# Patient Record
Sex: Female | Born: 1935 | Race: White | Hispanic: No | Smoking: Never smoker
Health system: Southern US, Community
[De-identification: ages and names within clinical notes are randomized; demographics above are authoritative.]

## PROBLEM LIST (undated history)

## (undated) DIAGNOSIS — M1990 Unspecified osteoarthritis, unspecified site: Secondary | ICD-10-CM

## (undated) DIAGNOSIS — M810 Age-related osteoporosis without current pathological fracture: Secondary | ICD-10-CM

## (undated) DIAGNOSIS — T7840XA Allergy, unspecified, initial encounter: Secondary | ICD-10-CM

## (undated) HISTORY — PX: ABDOMINAL HYSTERECTOMY: SHX81

## (undated) HISTORY — DX: Unspecified osteoarthritis, unspecified site: M19.90

## (undated) HISTORY — DX: Allergy, unspecified, initial encounter: T78.40XA

## (undated) HISTORY — DX: Age-related osteoporosis without current pathological fracture: M81.0

## (undated) HISTORY — PX: EYE SURGERY: SHX253

## (undated) HISTORY — PX: BREAST SURGERY: SHX581

---

## 1994-02-18 ENCOUNTER — Encounter (INDEPENDENT_AMBULATORY_CARE_PROVIDER_SITE_OTHER): Payer: Self-pay | Admitting: Gastroenterology

## 1998-01-08 ENCOUNTER — Other Ambulatory Visit: Admission: RE | Admit: 1998-01-08 | Discharge: 1998-01-08 | Payer: Self-pay | Admitting: Family Medicine

## 1998-01-10 ENCOUNTER — Other Ambulatory Visit: Admission: RE | Admit: 1998-01-10 | Discharge: 1998-01-10 | Payer: Self-pay | Admitting: Family Medicine

## 1999-12-07 ENCOUNTER — Encounter: Payer: Self-pay | Admitting: Family Medicine

## 1999-12-07 ENCOUNTER — Encounter: Admission: RE | Admit: 1999-12-07 | Discharge: 1999-12-07 | Payer: Self-pay | Admitting: Family Medicine

## 2000-01-12 ENCOUNTER — Encounter: Payer: Self-pay | Admitting: Family Medicine

## 2000-01-12 ENCOUNTER — Encounter: Admission: RE | Admit: 2000-01-12 | Discharge: 2000-01-12 | Payer: Self-pay | Admitting: Family Medicine

## 2000-02-26 ENCOUNTER — Encounter: Payer: Self-pay | Admitting: Family Medicine

## 2000-02-26 ENCOUNTER — Encounter: Admission: RE | Admit: 2000-02-26 | Discharge: 2000-02-26 | Payer: Self-pay | Admitting: Family Medicine

## 2000-03-24 ENCOUNTER — Encounter: Admission: RE | Admit: 2000-03-24 | Discharge: 2000-03-24 | Payer: Self-pay | Admitting: Family Medicine

## 2000-03-24 ENCOUNTER — Encounter: Payer: Self-pay | Admitting: Family Medicine

## 2000-05-13 ENCOUNTER — Ambulatory Visit (HOSPITAL_COMMUNITY): Admission: RE | Admit: 2000-05-13 | Discharge: 2000-05-13 | Payer: Self-pay | Admitting: Family Medicine

## 2000-12-08 ENCOUNTER — Encounter: Admission: RE | Admit: 2000-12-08 | Discharge: 2000-12-08 | Payer: Self-pay | Admitting: Family Medicine

## 2000-12-08 ENCOUNTER — Encounter: Payer: Self-pay | Admitting: Family Medicine

## 2001-11-27 ENCOUNTER — Encounter: Payer: Self-pay | Admitting: Family Medicine

## 2001-11-27 ENCOUNTER — Encounter: Admission: RE | Admit: 2001-11-27 | Discharge: 2001-11-27 | Payer: Self-pay | Admitting: Family Medicine

## 2002-01-15 ENCOUNTER — Other Ambulatory Visit: Admission: RE | Admit: 2002-01-15 | Discharge: 2002-01-15 | Payer: Self-pay | Admitting: Family Medicine

## 2002-10-19 ENCOUNTER — Encounter: Payer: Self-pay | Admitting: Family Medicine

## 2002-10-19 ENCOUNTER — Ambulatory Visit (HOSPITAL_COMMUNITY): Admission: RE | Admit: 2002-10-19 | Discharge: 2002-10-19 | Payer: Self-pay | Admitting: Family Medicine

## 2002-11-29 ENCOUNTER — Encounter: Admission: RE | Admit: 2002-11-29 | Discharge: 2002-11-29 | Payer: Self-pay | Admitting: Family Medicine

## 2002-11-29 ENCOUNTER — Encounter: Payer: Self-pay | Admitting: Family Medicine

## 2002-12-18 ENCOUNTER — Encounter: Admission: RE | Admit: 2002-12-18 | Discharge: 2002-12-18 | Payer: Self-pay | Admitting: Family Medicine

## 2002-12-18 ENCOUNTER — Encounter: Payer: Self-pay | Admitting: Family Medicine

## 2003-01-17 ENCOUNTER — Other Ambulatory Visit: Admission: RE | Admit: 2003-01-17 | Discharge: 2003-01-17 | Payer: Self-pay | Admitting: Family Medicine

## 2003-12-06 ENCOUNTER — Encounter: Admission: RE | Admit: 2003-12-06 | Discharge: 2003-12-06 | Payer: Self-pay | Admitting: Family Medicine

## 2004-01-24 ENCOUNTER — Other Ambulatory Visit: Admission: RE | Admit: 2004-01-24 | Discharge: 2004-01-24 | Payer: Self-pay | Admitting: Family Medicine

## 2004-01-27 ENCOUNTER — Encounter: Admission: RE | Admit: 2004-01-27 | Discharge: 2004-01-27 | Payer: Self-pay | Admitting: Family Medicine

## 2004-07-05 ENCOUNTER — Encounter: Admission: RE | Admit: 2004-07-05 | Discharge: 2004-07-05 | Payer: Self-pay | Admitting: Family Medicine

## 2004-11-30 ENCOUNTER — Ambulatory Visit (HOSPITAL_COMMUNITY): Admission: RE | Admit: 2004-11-30 | Discharge: 2004-11-30 | Payer: Self-pay | Admitting: Family Medicine

## 2005-01-26 ENCOUNTER — Encounter: Admission: RE | Admit: 2005-01-26 | Discharge: 2005-02-09 | Payer: Self-pay | Admitting: Family Medicine

## 2005-03-20 ENCOUNTER — Encounter: Admission: RE | Admit: 2005-03-20 | Discharge: 2005-03-20 | Payer: Self-pay | Admitting: Family Medicine

## 2005-04-09 ENCOUNTER — Encounter: Admission: RE | Admit: 2005-04-09 | Discharge: 2005-04-09 | Payer: Self-pay | Admitting: Family Medicine

## 2005-04-30 ENCOUNTER — Encounter: Admission: RE | Admit: 2005-04-30 | Discharge: 2005-04-30 | Payer: Self-pay | Admitting: Family Medicine

## 2005-05-21 ENCOUNTER — Encounter: Admission: RE | Admit: 2005-05-21 | Discharge: 2005-05-21 | Payer: Self-pay | Admitting: Family Medicine

## 2005-12-09 ENCOUNTER — Ambulatory Visit (HOSPITAL_COMMUNITY): Admission: RE | Admit: 2005-12-09 | Discharge: 2005-12-09 | Payer: Self-pay | Admitting: Family Medicine

## 2006-03-07 ENCOUNTER — Encounter: Admission: RE | Admit: 2006-03-07 | Discharge: 2006-06-05 | Payer: Self-pay | Admitting: Orthopedic Surgery

## 2006-07-13 ENCOUNTER — Encounter: Admission: RE | Admit: 2006-07-13 | Discharge: 2006-07-13 | Payer: Self-pay | Admitting: Family Medicine

## 2006-12-12 ENCOUNTER — Ambulatory Visit (HOSPITAL_COMMUNITY): Admission: RE | Admit: 2006-12-12 | Discharge: 2006-12-12 | Payer: Self-pay | Admitting: Family Medicine

## 2007-01-10 ENCOUNTER — Encounter: Admission: RE | Admit: 2007-01-10 | Discharge: 2007-01-10 | Payer: Self-pay | Admitting: Specialist

## 2007-12-06 ENCOUNTER — Encounter: Admission: RE | Admit: 2007-12-06 | Discharge: 2007-12-06 | Payer: Self-pay | Admitting: Family Medicine

## 2007-12-13 ENCOUNTER — Encounter: Admission: RE | Admit: 2007-12-13 | Discharge: 2007-12-13 | Payer: Self-pay | Admitting: Family Medicine

## 2008-02-02 ENCOUNTER — Encounter: Admission: RE | Admit: 2008-02-02 | Discharge: 2008-02-02 | Payer: Self-pay | Admitting: Family Medicine

## 2008-12-18 ENCOUNTER — Encounter: Admission: RE | Admit: 2008-12-18 | Discharge: 2008-12-18 | Payer: Self-pay | Admitting: Internal Medicine

## 2009-01-29 ENCOUNTER — Emergency Department (HOSPITAL_COMMUNITY): Admission: EM | Admit: 2009-01-29 | Discharge: 2009-01-29 | Payer: Self-pay | Admitting: Emergency Medicine

## 2009-07-02 ENCOUNTER — Ambulatory Visit: Payer: Self-pay | Admitting: Internal Medicine

## 2009-07-02 DIAGNOSIS — K589 Irritable bowel syndrome without diarrhea: Secondary | ICD-10-CM | POA: Insufficient documentation

## 2009-07-02 DIAGNOSIS — K5900 Constipation, unspecified: Secondary | ICD-10-CM | POA: Insufficient documentation

## 2009-12-19 ENCOUNTER — Encounter: Admission: RE | Admit: 2009-12-19 | Discharge: 2009-12-19 | Payer: Self-pay | Admitting: Internal Medicine

## 2010-11-24 ENCOUNTER — Other Ambulatory Visit: Payer: Self-pay | Admitting: Internal Medicine

## 2010-11-24 DIAGNOSIS — Z1231 Encounter for screening mammogram for malignant neoplasm of breast: Secondary | ICD-10-CM

## 2010-12-22 ENCOUNTER — Ambulatory Visit
Admission: RE | Admit: 2010-12-22 | Discharge: 2010-12-22 | Disposition: A | Discharge status: Home / Self Care | Payer: Medicare Other | Source: Ambulatory Visit | Attending: Internal Medicine | Admitting: Internal Medicine

## 2010-12-22 DIAGNOSIS — Z1231 Encounter for screening mammogram for malignant neoplasm of breast: Secondary | ICD-10-CM

## 2012-01-10 ENCOUNTER — Other Ambulatory Visit: Payer: Self-pay | Admitting: Internal Medicine

## 2012-01-10 DIAGNOSIS — Z1231 Encounter for screening mammogram for malignant neoplasm of breast: Secondary | ICD-10-CM

## 2012-01-12 ENCOUNTER — Ambulatory Visit
Admission: RE | Admit: 2012-01-12 | Discharge: 2012-01-12 | Disposition: A | Discharge status: Home / Self Care | Payer: Medicare Other | Source: Ambulatory Visit | Attending: Internal Medicine | Admitting: Internal Medicine

## 2012-01-12 DIAGNOSIS — Z1231 Encounter for screening mammogram for malignant neoplasm of breast: Secondary | ICD-10-CM

## 2012-02-28 ENCOUNTER — Encounter: Payer: Self-pay | Admitting: Family Medicine

## 2012-02-28 ENCOUNTER — Ambulatory Visit (INDEPENDENT_AMBULATORY_CARE_PROVIDER_SITE_OTHER): Payer: Medicare Other | Admitting: Family Medicine

## 2012-02-28 VITALS — BP 157/61 | HR 73 | Temp 98.3°F | Resp 16 | Ht 63.0 in | Wt 143.0 lb

## 2012-02-28 DIAGNOSIS — R21 Rash and other nonspecific skin eruption: Secondary | ICD-10-CM

## 2012-02-28 DIAGNOSIS — J029 Acute pharyngitis, unspecified: Secondary | ICD-10-CM

## 2012-02-28 MED ORDER — CEPHALEXIN 500 MG PO CAPS: 500.0000 mg | ORAL_CAPSULE | Freq: Two times a day (BID) | ORAL | Status: AC

## 2012-02-28 NOTE — Progress Notes (Signed)
An 78 year old woman comes in with 2 problems. She's had 3 weeks of sore throat following the initiation of Cymbalta medication for irritable bladder. She also has multiple red papules on her forearms after working in the [employer] at her house 2 days ago.  She's had no constitutional sx. States she went to Dr. Vicente Males office and the physician assistant new less than she does. The Cymbalta is comes here $200 a month.  Objective: Mild erythema in the pharynx bilaterally worse on the left. Neck shows no stiffness or adenopathy  Patient's skin shows multiple 1 mm nonparetic erythematous papules which is covered with a Band-Aid. She has had one area on the right forearm that 1 cm violaceous, and macerated.  Assessment: Mild cellulitis right arm, trigger bites, pharyngitis.  Plan: Advised to stop the Cymbalta  Start Keflex 500 twice a day for a week

## 2012-03-04 ENCOUNTER — Telehealth: Payer: Self-pay

## 2012-03-04 MED ORDER — AZITHROMYCIN 250 MG PO TABS: ORAL_TABLET | ORAL | Status: AC

## 2012-03-04 MED ORDER — DIPHENHYD-HYDROCORT-NYSTATIN MT SUSP: 5.0000 mL | Freq: Four times a day (QID) | OROMUCOSAL | Status: DC | PRN

## 2012-03-04 NOTE — Telephone Encounter (Signed)
Sent in z pack and duke's magic mouthwash.

## 2012-03-04 NOTE — Telephone Encounter (Signed)
LMOM notifying daughter to d/c Cephalexin and pickup new meds rx'd.  CB if any further ?'s.

## 2012-03-04 NOTE — Telephone Encounter (Signed)
PATIENT'S DAUGHTER CALLED TO SAY HER MOTHER SAW DR. Milus Glazier LAST WEEK FOR A SORE THROAT. SHE WAS GIVEN CEPHALEXIN 500MG . SHE IS HAVING  DIARRHEA,VOMITING,NAUSEA & DIZZINESS. THEY CALLED THE PHARMACIST AND HE SAID THESE WERE COMMON SIDE EFFECTS OF THIS MEDICATION. PLEASE CALL THEM BACK TO TELL HER SOMETHING ELSE SHE CAN TAKE. SHE ALSO STILL HAS HER SORE THROAT. BEST PHONE [phone] (DAUGHTER'S NAME IS SUSAN Crespo)   PHARMACY CHOICE IS (I FORGOT TO ASK)     MBC

## 2013-07-12 ENCOUNTER — Other Ambulatory Visit: Payer: Self-pay

## 2013-07-12 DIAGNOSIS — Z1231 Encounter for screening mammogram for malignant neoplasm of breast: Secondary | ICD-10-CM

## 2013-08-02 ENCOUNTER — Ambulatory Visit: Payer: Medicare Other

## 2014-09-02 ENCOUNTER — Ambulatory Visit (INDEPENDENT_AMBULATORY_CARE_PROVIDER_SITE_OTHER): Payer: Medicare Other | Admitting: Family Medicine

## 2014-09-02 VITALS — BP 128/70 | HR 72 | Temp 98.0°F | Resp 16 | Ht 63.0 in | Wt 140.0 lb

## 2014-09-02 DIAGNOSIS — R197 Diarrhea, unspecified: Secondary | ICD-10-CM

## 2014-09-02 DIAGNOSIS — B349 Viral infection, unspecified: Secondary | ICD-10-CM

## 2014-09-02 DIAGNOSIS — R05 Cough: Secondary | ICD-10-CM

## 2014-09-02 DIAGNOSIS — R059 Cough, unspecified: Secondary | ICD-10-CM

## 2014-09-02 LAB — POCT CBC
Granulocyte percent: 65.3 % (ref 37–80)
HCT, POC: 43 % (ref 37.7–47.9)
Hemoglobin: 13.8 g/dL (ref 12.2–16.2)
Lymph, poc: 3.9 — AB (ref 0.6–3.4)
MCH, POC: 28.7 pg (ref 27–31.2)
MCHC: 32.2 g/dL (ref 31.8–35.4)
MCV: 89.1 fL (ref 80–97)
MID (cbc): 0.1 (ref 0–0.9)
MPV: 6.6 fL (ref 0–99.8)
POC Granulocyte: 7.6 — AB (ref 2–6.9)
POC LYMPH PERCENT: 33.6 % (ref 10–50)
POC MID %: 1.1 % (ref 0–12)
Platelet Count, POC: 301 10*3/uL (ref 142–424)
RBC: 4.83 M/uL (ref 4.04–5.48)
RDW, POC: 14.7 %
WBC: 11.6 10*3/uL — AB (ref 4.6–10.2)

## 2014-09-02 LAB — COMPLETE METABOLIC PANEL WITHOUT GFR
AST: 21 U/L (ref 0–37)
Albumin: 4.4 g/dL (ref 3.5–5.2)
Alkaline Phosphatase: 50 U/L (ref 39–117)
CO2: 30 meq/L (ref 19–32)
Glucose, Bld: 101 mg/dL — ABNORMAL HIGH (ref 70–99)
Sodium: 138 meq/L (ref 135–145)
Total Protein: 7.1 g/dL (ref 6.0–8.3)

## 2014-09-02 LAB — COMPLETE METABOLIC PANEL WITH GFR
ALT: 15 U/L (ref 0–35)
BUN: 14 mg/dL (ref 6–23)
Calcium: 10.1 mg/dL (ref 8.4–10.5)
Chloride: 98 mEq/L (ref 96–112)
Creat: 0.76 mg/dL (ref 0.50–1.10)
GFR, Est African American: 82 mL/min
GFR, Est Non African American: 71 mL/min
Potassium: 4.3 mEq/L (ref 3.5–5.3)
Total Bilirubin: 0.6 mg/dL (ref 0.2–1.2)

## 2014-09-02 NOTE — Patient Instructions (Signed)
Food Choices to Help Relieve Diarrhea When you have diarrhea, the foods you eat and your eating habits are very important. Choosing the right foods and drinks can help relieve diarrhea. Also, because diarrhea can last up to 7 days, you need to replace lost fluids and electrolytes (such as sodium, potassium, and chloride) in order to help prevent dehydration.  WHAT GENERAL GUIDELINES DO I NEED TO FOLLOW?  Slowly drink 1 cup (8 oz) of fluid for each episode of diarrhea. If you are getting enough fluid, your urine will be clear or pale yellow.  Eat starchy foods. Some good choices include white rice, white toast, pasta, low-fiber cereal, baked potatoes (without the skin), saltine crackers, and bagels.  Avoid large servings of any cooked vegetables.  Limit fruit to two servings per day. A serving is  cup or 1 small piece.  Choose foods with less than 2 g of fiber per serving.  Limit fats to less than 8 tsp (38 g) per day.  Avoid fried foods.  Eat foods that have probiotics in them. Probiotics can be found in certain dairy products.  Avoid foods and beverages that may increase the speed at which food moves through the stomach and intestines (gastrointestinal tract). Things to avoid include:  High-fiber foods, such as dried fruit, raw fruits and vegetables, nuts, seeds, and whole grain foods.  Spicy foods and high-fat foods.  Foods and beverages sweetened with high-fructose corn syrup, honey, or sugar alcohols such as xylitol, sorbitol, and mannitol. WHAT FOODS ARE RECOMMENDED? Grains White rice. White, French, or pita breads (fresh or toasted), including plain rolls, buns, or bagels. White pasta. Saltine, soda, or graham crackers. Pretzels. Low-fiber cereal. Cooked cereals made with water (such as cornmeal, farina, or cream cereals). Plain muffins. Matzo. Melba toast. Zwieback.  Vegetables Potatoes (without the skin). Strained tomato and vegetable juices. Most well-cooked and canned  vegetables without seeds. Tender lettuce. Fruits Cooked or canned applesauce, apricots, cherries, fruit cocktail, grapefruit, peaches, pears, or plums. Fresh bananas, apples without skin, cherries, grapes, cantaloupe, grapefruit, peaches, oranges, or plums.  Meat and Other Protein Products Baked or boiled chicken. Eggs. Tofu. Fish. Seafood. Smooth peanut butter. Ground or well-cooked tender beef, ham, veal, lamb, pork, or poultry.  Dairy Plain yogurt, kefir, and unsweetened liquid yogurt. Lactose-free milk, buttermilk, or soy milk. Plain hard cheese. Beverages Sport drinks. Clear broths. Diluted fruit juices (except prune). Regular, caffeine-free sodas such as ginger ale. Water. Decaffeinated teas. Oral rehydration solutions. Sugar-free beverages not sweetened with sugar alcohols. Other Bouillon, broth, or soups made from recommended foods.  The items listed above may not be a complete list of recommended foods or beverages. Contact your dietitian for more options. WHAT FOODS ARE NOT RECOMMENDED? Grains Whole grain, whole wheat, bran, or rye breads, rolls, pastas, crackers, and cereals. Wild or brown rice. Cereals that contain more than 2 g of fiber per serving. Corn tortillas or taco shells. Cooked or dry oatmeal. Granola. Popcorn. Vegetables Raw vegetables. Cabbage, broccoli, Brussels sprouts, artichokes, baked beans, beet greens, corn, kale, legumes, peas, sweet potatoes, and yams. Potato skins. Cooked spinach and cabbage. Fruits Dried fruit, including raisins and dates. Raw fruits. Stewed or dried prunes. Fresh apples with skin, apricots, mangoes, pears, raspberries, and strawberries.  Meat and Other Protein Products Chunky peanut butter. Nuts and seeds. Beans and lentils. Bacon.  Dairy High-fat cheeses. Milk, chocolate milk, and beverages made with milk, such as milk shakes. Cream. Ice cream. Sweets and Desserts Sweet rolls, doughnuts, and sweet breads. Pancakes   and waffles. Fats and  Oils Butter. Cream sauces. Margarine. Salad oils. Plain salad dressings. Olives. Avocados.  Beverages Caffeinated beverages (such as coffee, tea, soda, or energy drinks). Alcoholic beverages. Fruit juices with pulp. Prune juice. Soft drinks sweetened with high-fructose corn syrup or sugar alcohols. Other Coconut. Hot sauce. Chili powder. Mayonnaise. Gravy. Cream-based or milk-based soups.  The items listed above may not be a complete list of foods and beverages to avoid. Contact your dietitian for more information. WHAT SHOULD I DO IF I BECOME DEHYDRATED? Diarrhea can sometimes lead to dehydration. Signs of dehydration include dark urine and dry mouth and skin. If you think you are dehydrated, you should rehydrate with an oral rehydration solution. These solutions can be purchased at pharmacies, retail stores, or online.  Drink -1 cup (120-240 mL) of oral rehydration solution each time you have an episode of diarrhea. If drinking this amount makes your diarrhea worse, try drinking smaller amounts more often. For example, drink 1-3 tsp (5-15 mL) every 5-10 minutes.  A general rule for staying hydrated is to drink 1-2 L of fluid per day. Talk to your health care provider about the specific amount you should be drinking each day. Drink enough fluids to keep your urine clear or pale yellow. Document Released: 12/04/2003 Document Revised: 09/18/2013 Document Reviewed: 08/06/2013 [facility] Patient Information 2015 [place], [place]. This information is not intended to replace advice given to you by your health care provider. Make sure you discuss any questions you have with your health care provider. Viral Infections A virus is a type of germ. Viruses can cause:  Minor sore throats.  Aches and pains.  Headaches.  Runny nose.  Rashes.  Watery eyes.  Tiredness.  Coughs.  Loss of appetite.  Feeling sick to your stomach (nausea).  Throwing up (vomiting).  Watery poop (diarrhea). HOME CARE    Only take medicines as told by your doctor.  Drink enough water and fluids to keep your pee (urine) clear or pale yellow. Sports drinks are a good choice.  Get plenty of rest and eat healthy. Soups and broths with crackers or rice are fine. GET HELP RIGHT AWAY IF:   You have a very bad headache.  You have shortness of breath.  You have chest pain or neck pain.  You have an unusual rash.  You cannot stop throwing up.  You have watery poop that does not stop.  You cannot keep fluids down.  You or your child has a temperature by mouth above 102 F (38.9 C), not controlled by medicine.  Your baby is older than 3 months with a rectal temperature of 102 F (38.9 C) or higher.  Your baby is 213 months old or younger with a rectal temperature of 100.4 F (38 C) or higher. MAKE SURE YOU:   Understand these instructions.  Will watch this condition.  Will get help right away if you are not doing well or get worse. Document Released: 08/26/2008 Document Revised: 12/06/2011 Document Reviewed: 01/19/2011 [facility] Patient Information 2015 [place], [place]. This information is not intended to replace advice given to you by your health care provider. Make sure you discuss any questions you have with your health care provider.

## 2014-09-02 NOTE — Progress Notes (Signed)
Chief Complaint:  Chief Complaint  Patient presents with  . Laryngitis    x1 week  . Cough  . Diarrhea    x2 weeks   . Fatigue  . Shaking    HPI: Theresa Cross is a 80 y.o. female who is here for  Multiple sxs, she has had nonbloody diarrhea x 2 weeks, has had a cough and  laryngitis for 1 week , she was put on prednisone and 2 different antibiotics by her PCPs office, took her off prednisone and abx since she was shaking and having diarrhea . Has put on immodium, tried eating different things and building bacteria and nothing is helping. No fever or chills, CP , SOB. The shaking has stopped. No stool samples were taken. She has been eating and drinking ok.  Colonscopy was normal but that was many years ago. Deneis abd pain, n.ausea or vomiting   Past Medical History  Diagnosis Date  . Allergy   . Arthritis   . Osteoporosis    Past Surgical History  Procedure Laterality Date  . Breast surgery    . Eye surgery    . Abdominal hysterectomy     History   Social History  . Marital Status: Widowed    Spouse Name: N/A    Number of Children: N/A  . Years of Education: N/A   Social History Main Topics  . Smoking status: Never Smoker   . Smokeless tobacco: None  . Alcohol Use: None  . Drug Use: None  . Sexual Activity: None   Other Topics Concern  . None   Social History Narrative   Family History  Problem Relation Age of Onset  . Cancer Mother   . Heart disease Mother   . Cancer Brother   . Cancer Brother    Allergies  Allergen Reactions  . Amoxicillin   . Codeine   . Gabapentin   . Morphine   . Morphine Sulfate    Prior to Admission medications   Medication Sig Start Date End Date Taking? Authorizing Provider  escitalopram (LEXAPRO) 5 MG tablet Take 5 mg by mouth daily.   Yes Historical Provider, MD  fluticasone (FLONASE) 50 MCG/ACT nasal spray Place 2 sprays into both nostrils daily.   Yes Historical Provider, MD  levofloxacin (LEVAQUIN) 500 MG  tablet Take 500 mg by mouth daily.   Yes Historical Provider, MD  loperamide (IMODIUM A-D) 2 MG tablet Take 2 mg by mouth 4 (four) times daily as needed for diarrhea or loose stools.   Yes Historical Provider, MD  predniSONE (DELTASONE) 20 MG tablet Take 20 mg by mouth daily with breakfast.   Yes Historical Provider, MD  Probiotic Product (ALIGN PO) Take by mouth.   Yes Historical Provider, MD     ROS: The patient denies fevers, chills, night sweats, unintentional weight loss, chest pain, palpitations, wheezing, dyspnea on exertion, nausea, vomiting, abdominal pain, dysuria, hematuria, melena, numbness, weakness, or tingling.   All other systems have been reviewed and were otherwise negative with the exception of those mentioned in the HPI and as above.    PHYSICAL EXAM: Filed Vitals:   09/02/14 1243  BP: 128/70  Pulse: 72  Temp: 98 F (36.7 C)  Resp: 16   Filed Vitals:   09/02/14 1243  Height: 5\' 3"  (1.6 m)  Weight: 140 lb (63.504 kg)   Body mass index is 24.81 kg/(m^2).  General: Alert, no acute distress HEENT:  Normocephalic, atraumatic, oropharynx patent.  EOMI, PERRLA. TM nl, erythematous throat, No exudates. + boggy nares, neg sinus tenderness. Cardiovascular:  Regular rate and rhythm, no rubs murmurs or gallops.  No Carotid bruits, radial pulse intact. No pedal edema.  Respiratory: Clear to auscultation bilaterally.  No wheezes, rales, or rhonchi.  No cyanosis, no use of accessory musculature GI: No organomegaly, abdomen is soft and non-tender, positive bowel sounds.  No masses. Skin: No rashes. Neurologic: Facial musculature symmetric. Psychiatric: Patient is appropriate throughout our interaction. Lymphatic: No cervical lymphadenopathy Musculoskeletal: Gait intact.   LABS: Results for orders placed or performed in visit on 09/02/14  POCT CBC  Result Value Ref Range   WBC 11.6 (A) 4.6 - 10.2 K/uL   Lymph, poc 3.9 (A) 0.6 - 3.4   POC LYMPH PERCENT 33.6 10 - 50 %L    MID (cbc) 0.1 0 - 0.9   POC MID % 1.1 0 - 12 %M   POC Granulocyte 7.6 (A) 2 - 6.9   Granulocyte percent 65.3 37 - 80 %G   RBC 4.83 4.04 - 5.48 M/uL   Hemoglobin 13.8 12.2 - 16.2 g/dL   HCT, POC 13.043.0 86.537.7 - 47.9 %   MCV 89.1 80 - 97 fL   MCH, POC 28.7 27 - 31.2 pg   MCHC 32.2 31.8 - 35.4 g/dL   RDW, POC 78.414.7 %   Platelet Count, POC 301 142 - 424 K/uL   MPV 6.6 0 - 99.8 fL     EKG/XRAY:   [facility]   ASSESSMENT/PLAN: Encounter Diagnoses  Name Primary?  . Diarrhea   . Cough   . Viral illness Yes   Probably still viral illness, Will bring back stool Cx and C Diff before I give her anything She was already on 2 abx and that did not help.  Will f/u with labs, push fluids. Take otc cough meds ie delsyn Will detemine treatement once get stool results.   Gross sideeffects, risk and benefits, and alternatives of medications d/w patient. Patient is aware that all medications have potential sideeffects and we are unable to predict every sideeffect or drug-drug interaction that may occur.  Afnan Emberton PHUONG, DO 09/02/2014 2:27 PM

## 2014-09-03 ENCOUNTER — Telehealth: Payer: Self-pay

## 2014-09-03 ENCOUNTER — Other Ambulatory Visit: Payer: Self-pay | Admitting: Family Medicine

## 2014-09-03 MED ORDER — DIPHENOXYLATE-ATROPINE 2.5-0.025 MG PO TABS: 1.0000 | ORAL_TABLET | Freq: Every day | ORAL | Status: DC | PRN

## 2014-09-03 NOTE — Telephone Encounter (Signed)
Pt also left a note in her bag with her stool samples she left. The note says that she is still having loose bowels.  The note also says that her throat is sore and she is shaky.

## 2014-09-03 NOTE — Telephone Encounter (Signed)
Pt states she needs to speak with Dr. Conley RollsLe. Patient wants something better for the condition she was seen for yesterday. Please return call and advise. Patient wants to speak with Dr. Conley RollsLe. Cb # [phone]

## 2014-09-03 NOTE — Telephone Encounter (Signed)
Pt states that she is having severe diarrhea to the point her rear is raw. Pt is concerned that she will not be able to get a prescription.  Advised her to increase fluids- pt stated she has been drinking 7up and taking Immodium. Encouraged her to not drink sugary fluids, try water plain or Gator aid with the electrolytes. Pt is going to have daughter pick up some. Advised her to use warm cloths to clean herself to help with the rawness instead of toilet tissue.   Sample has been dropped off to the office. Pending results. Please advise if pt can take something else for the diarrhea.    Daughter [phone] work [phone]

## 2014-09-03 NOTE — Telephone Encounter (Signed)
He throat is fine, she is having diarrhea, no stomach pains. No fevers, will try a short course of lomotil and see how she does, stool cx and c diff pending

## 2014-09-04 ENCOUNTER — Other Ambulatory Visit: Payer: Self-pay | Admitting: Radiology

## 2014-09-04 DIAGNOSIS — R197 Diarrhea, unspecified: Secondary | ICD-10-CM

## 2014-09-05 NOTE — Addendum Note (Signed)
Addended by: Sheldon SilvanEIS, Klea Nall E on: 09/05/2014 04:53 PM   Modules accepted: Orders

## 2014-09-06 LAB — CLOSTRIDIUM DIFFICILE BY PCR: Toxigenic C. Difficile by PCR: NOT DETECTED

## 2014-09-07 LAB — STOOL CULTURE

## 2014-09-10 ENCOUNTER — Telehealth: Payer: Self-pay

## 2014-09-10 NOTE — Telephone Encounter (Signed)
Amy from [facility] called and left a message to request medical records for pt. Called back and left her a message that she will need to fax us a written request for the records.

## 2015-12-03 ENCOUNTER — Ambulatory Visit (INDEPENDENT_AMBULATORY_CARE_PROVIDER_SITE_OTHER): Payer: Medicare Other | Admitting: Emergency Medicine

## 2015-12-03 VITALS — BP 168/80 | HR 76 | Temp 97.8°F | Resp 17 | Ht 63.0 in | Wt 141.0 lb

## 2015-12-03 DIAGNOSIS — R21 Rash and other nonspecific skin eruption: Secondary | ICD-10-CM | POA: Diagnosis not present

## 2015-12-03 LAB — POCT SKIN KOH: Skin KOH, POC: NEGATIVE

## 2015-12-03 MED ORDER — TRIAMCINOLONE 0.1 % CREAM:EUCERIN CREAM 1:1: 1.0000 "application " | TOPICAL_CREAM | Freq: Two times a day (BID) | CUTANEOUS | Status: DC

## 2015-12-03 NOTE — Patient Instructions (Signed)
Eczema Eczema, also called atopic dermatitis, is a skin disorder that causes inflammation of the skin. It causes a red rash and dry, scaly skin. The skin becomes very itchy. Eczema is generally worse during the cooler winter months and often improves with the warmth of summer. Eczema usually starts showing signs in infancy. Some children outgrow eczema, but it may last through adulthood.  CAUSES  The exact cause of eczema is not known, but it appears to run in families. People with eczema often have a family history of eczema, allergies, asthma, or hay fever. Eczema is not contagious. Flare-ups of the condition may be caused by:   Contact with something you are sensitive or allergic to.   Stress. SIGNS AND SYMPTOMS  Dry, scaly skin.   Red, itchy rash.   Itchiness. This may occur before the skin rash and may be very intense.  DIAGNOSIS  The diagnosis of eczema is usually made based on symptoms and medical history. TREATMENT  Eczema cannot be cured, but symptoms usually can be controlled with treatment and other strategies. A treatment plan might include:  Controlling the itching and scratching.   Use over-the-counter antihistamines as directed for itching. This is especially useful at night when the itching tends to be worse.   Use over-the-counter steroid creams as directed for itching.   Avoid scratching. Scratching makes the rash and itching worse. It may also result in a skin infection (impetigo) due to a break in the skin caused by scratching.   Keeping the skin well moisturized with creams every day. This will seal in moisture and help prevent dryness. Lotions that contain alcohol and water should be avoided because they can dry the skin.   Limiting exposure to things that you are sensitive or allergic to (allergens).   Recognizing situations that cause stress.   Developing a plan to manage stress.  HOME CARE INSTRUCTIONS   Only take over-the-counter or  prescription medicines as directed by your health care provider.   Do not use anything on the skin without checking with your health care provider.   Keep baths or showers short (5 minutes) in warm (not hot) water. Use mild cleansers for bathing. These should be unscented. You may add nonperfumed bath oil to the bath water. It is best to avoid soap and bubble bath.   Immediately after a bath or shower, when the skin is still damp, apply a moisturizing ointment to the entire body. This ointment should be a petroleum ointment. This will seal in moisture and help prevent dryness. The thicker the ointment, the better. These should be unscented.   Keep fingernails cut short. Children with eczema may need to wear soft gloves or mittens at night after applying an ointment.   Dress in clothes made of cotton or cotton blends. Dress lightly, because heat increases itching.   A child with eczema should stay away from anyone with fever blisters or cold sores. The virus that causes fever blisters (herpes simplex) can cause a serious skin infection in children with eczema. SEEK MEDICAL CARE IF:   Your itching interferes with sleep.   Your rash gets worse or is not better within 1 week after starting treatment.   You see pus or soft yellow scabs in the rash area.   You have a fever.   You have a rash flare-up after contact with someone who has fever blisters.    This information is not intended to replace advice given to you by your health care   provider. Make sure you discuss any questions you have with your health care provider.   Document Released: 09/10/2000 Document Revised: 07/04/2013 Document Reviewed: 04/16/2013 Elsevier Interactive Patient Education 2016 [employer]vier Inc.  

## 2015-12-03 NOTE — Progress Notes (Signed)
By signing my name below, I, Stann Ore, attest that this documentation has been prepared under the direction and in the presence of Lesle Chris, MD. Electronically Signed: Stann Ore, Scribe. 12/03/2015 , 12:10 PM .  Patient was seen in room 3 .  Chief Complaint:  Chief Complaint  Patient presents with  . Rash    on legs    HPI: Theresa Cross is a 82 y.o. female who reports to [facility] today complaining of a rash over the back of her right leg that was noticed this morning. She mentions that the areas aren't itchy until after she took a shower this morning. She also states having a rash spot over her forehead between her eye brows that was noted about a month ago. She informs having a new cat in her house.   Past Medical History  Diagnosis Date  . Allergy   . Arthritis   . Osteoporosis    Past Surgical History  Procedure Laterality Date  . Breast surgery    . Eye surgery    . Abdominal hysterectomy     Social History   Social History  . Marital Status: Widowed    Spouse Name: N/A  . Number of Children: N/A  . Years of Education: N/A   Social History Main Topics  . Smoking status: Never Smoker   . Smokeless tobacco: None  . Alcohol Use: None  . Drug Use: None  . Sexual Activity: Not Asked   Other Topics Concern  . None   Social History Narrative   Family History  Problem Relation Age of Onset  . Cancer Mother   . Heart disease Mother   . Cancer Brother   . Cancer Brother    Allergies  Allergen Reactions  . Amoxicillin   . Codeine   . Gabapentin   . Morphine   . Morphine Sulfate    Prior to Admission medications   Not on File     ROS:  Constitutional: negative for fever, chills, night sweats, weight changes, or fatigue  HEENT: negative for vision changes, hearing loss, congestion, rhinorrhea, ST, epistaxis, or sinus pressure Cardiovascular: negative for chest pain or palpitations Respiratory: negative for hemoptysis, wheezing, shortness of  breath, or cough Abdominal: negative for abdominal pain, nausea, vomiting, diarrhea, or constipation Dermatological: positive for rash Neurologic: negative for headache, dizziness, or syncope All other systems reviewed and are otherwise negative with the exception to those above and in the HPI.  PHYSICAL EXAM: Filed Vitals:   12/03/15 1154  BP: 168/80  Pulse: 76  Temp: 97.8 F (36.6 C)  Resp: 17   Body mass index is 24.98 kg/(m^2).   General: Alert, no acute distress HEENT:  Normocephalic, atraumatic, oropharynx patent. Eye: [facility] Cardiovascular:  Regular rate and rhythm, no rubs murmurs or gallops.  No Carotid bruits, radial pulse intact. No pedal edema.  Respiratory: Clear to auscultation bilaterally.  No wheezes, rales, or rhonchi.  No cyanosis, no use of accessory musculature Abdominal: No organomegaly, abdomen is soft and non-tender, positive bowel sounds. No masses. Musculoskeletal: Gait intact. No edema, tenderness Skin: 1x1cm scaly red patch over back of right calf, does have dry skin, 1 other area of redness and scaling which is about 0.5cm in size adjacent to the first lesion Neurologic: Facial musculature symmetric. Psychiatric: Patient acts appropriately throughout our interaction.  Lymphatic: No cervical or submandibular lymphadenopathy Genitourinary/Anorectal: No acute findings  LABS: Results for orders placed or performed in visit on 12/03/15  POCT Skin KOH  Result Value Ref Range   Skin KOH, POC Negative     EKG/XRAY:     ASSESSMENT/PLAN:  The red patches look like eczema to me. Will treat with triamcinolone mixed with Eucerin twice a day. If the rash is not gone in 2 weeks I would like to see her back her blood pressure was up today and she is referred back to her PCP for recheck..I personally performed the services described in this documentation, which was scribed in my presence. The recorded information has been reviewed and is accurate.  Gross  sideeffects, risk and benefits, and alternatives of medications d/w patient. Patient is aware that all medications have potential sideeffects and we are unable to predict every sideeffect or drug-drug interaction that may occur.  Lesle ChrisSteven Wilson Sample MD 12/03/2015 12:10 PM

## 2016-01-26 ENCOUNTER — Ambulatory Visit (INDEPENDENT_AMBULATORY_CARE_PROVIDER_SITE_OTHER): Payer: Medicare Other | Admitting: Physician Assistant

## 2016-01-26 VITALS — BP 158/76 | HR 85 | Temp 98.3°F | Resp 18 | Ht 62.0 in | Wt 146.0 lb

## 2016-01-26 DIAGNOSIS — Z23 Encounter for immunization: Secondary | ICD-10-CM | POA: Insufficient documentation

## 2016-01-26 DIAGNOSIS — S61412A Laceration without foreign body of left hand, initial encounter: Secondary | ICD-10-CM

## 2016-01-26 NOTE — Progress Notes (Signed)
   01/26/2016 1:17 PM   DOB: 04/01/1925 / MRN: [id number]  SUBJECTIVE:  Theresa Cross is a 82 y.o. female presenting for multiple lacerations of the left anteroproximal index finger.  Was using a hedge trimmer this morning and attempted to pick up the trimmer by the trimmer end.  She denies any weakness of the finger and changes in sensation distal to the injury.  She does not take ASA or blood thinners and has enjoyed good health.    She is allergic to amoxicillin; codeine; gabapentin; morphine; and morphine sulfate.   She  has a past medical history of Allergy; Arthritis; and Osteoporosis.    She  reports that she has never smoked. She does not have any smokeless tobacco history on file. She  has no sexual activity history on file. The patient  has past surgical history that includes Breast surgery; Eye surgery; and Abdominal hysterectomy.  Her family history includes Cancer in her brother, brother, and mother; Heart disease in her mother.  Review of Systems  Constitutional: Negative for fever.  Skin: Negative for rash.  Neurological: Negative for dizziness, tingling, sensory change and focal weakness.    Problem list and medications reviewed and updated by myself where necessary, and exist elsewhere in the encounter.   OBJECTIVE:  BP 158/76 mmHg  Pulse 85  Temp(Src) 98.3 F (36.8 C)  Resp 18  Ht 5\' 2"  (1.575 m)  Wt 146 lb (66.225 kg)  BMI 26.70 kg/m2  SpO2 95%  Physical Exam  Constitutional: She is oriented to person, place, and time.  Cardiovascular: Normal rate.   Musculoskeletal:       Hands: Neurological: She is alert and oriented to person, place, and time.  Nursing note and vitals reviewed.  Risk and benefits discussed and verbal consent obtained. Anesthetic allergies reviewed. Patient anesthetized using 1:1 mix of 2% lidocaine without epi and Marcaine. The wound was cleansed thoroughly with soap and water. Sterile prep and drape. Wound closed with 10 throws using 4-0  Ethilon suture material. Hemostasis achieved. Mupirocin applied to the wound and bandage placed. The patient tolerated well. Wound instructions were provided and the patient is to return in 10 days for suture removal.   No results found for this or any previous visit (from the past 72 hour(s)).  No results found.  ASSESSMENT AND PLAN  Theresa Cross was seen today for laceration.  Diagnoses and all orders for this visit:  Hand laceration, left, initial encounter: Wound repaired, however it is a difficult repair given the nature of the injury.  Wounds grossly approximated with 10 sutures using both simple and horizontal mattress throws.  Mupirocin and bandage applied by me.  She is to RTC in ten days for removal.  Anticipatory guidance discussed with her at the clinic.   Need for Tdap vaccination -     Tdap vaccine greater than or equal to 7yo IM    The patient was advised to call or return to clinic if she does not see an improvement in symptoms or to seek the care of the closest emergency department if she worsens with the above plan.   Deliah BostonMichael Lorilei Horan, MHS, PA-C [facility] 01/26/2016 1:17 PM

## 2016-01-26 NOTE — Patient Instructions (Signed)
     IF you received an x-ray today, you will receive an invoice from [facility]. Please contact [facility] at [phone] with questions or concerns regarding your invoice.   IF you received labwork today, you will receive an invoice from [facility]. Please contact [facility] at [phone] with questions or concerns regarding your invoice.   Our billing staff will not be able to assist you with questions regarding bills from these companies.  You will be contacted with the lab results as soon as they are available. The fastest way to get your results is to activate your My Chart account. Instructions are located on the last page of this paperwork. If you have not heard from us regarding the results in 2 weeks, please contact this office.      

## 2016-02-05 ENCOUNTER — Ambulatory Visit (INDEPENDENT_AMBULATORY_CARE_PROVIDER_SITE_OTHER): Payer: Medicare Other | Admitting: Family Medicine

## 2016-02-05 VITALS — BP 144/82 | HR 78 | Temp 98.2°F | Resp 18 | Ht 62.0 in

## 2016-02-05 DIAGNOSIS — S61412D Laceration without foreign body of left hand, subsequent encounter: Secondary | ICD-10-CM

## 2016-02-05 NOTE — Patient Instructions (Signed)
     IF you received an x-ray today, you will receive an invoice from [facility]. Please contact [facility] at [phone] with questions or concerns regarding your invoice.   IF you received labwork today, you will receive an invoice from [facility]. Please contact [facility] at [phone] with questions or concerns regarding your invoice.   Our billing staff will not be able to assist you with questions regarding bills from these companies.  You will be contacted with the lab results as soon as they are available. The fastest way to get your results is to activate your My Chart account. Instructions are located on the last page of this paperwork. If you have not heard from us regarding the results in 2 weeks, please contact this office.      

## 2016-02-05 NOTE — Progress Notes (Signed)
   Subjective:    Patient ID: Theresa Cross, female    DOB: 05/01/1925, 82 y.o.   MRN: [id number]  HPI This is a pleasant 82 yo female who presents today for suture removal. She had sutures placed on left pointer finger. She did not remove the dressing that was placed following the suture placement.      Review of Systems No pain, no fever, no swelling or redness    Objective:   Physical Exam Pleasant female in NAD.  Bandage was soaked for removal. Small amount scabbing. No erythema. Skin soft. Edges well approximated. Sutures x 10 removed. Small amount bleeding, stopped with hand washing. Band aid applied.    BP 144/82 mmHg  Pulse 78  Temp(Src) 98.2 F (36.8 C) (Oral)  Resp 18  Ht 5\' 2"  (1.575 m)  SpO2 96% Wt Readings from Last 3 Encounters:  01/26/16 146 lb (66.225 kg)  12/03/15 141 lb (63.957 kg)  09/02/14 140 lb (63.504 kg)       Assessment & Plan:  1. Hand laceration, left, subsequent encounter - sutures removed, patient tolerated well, instructions given to keep clean and dry and leave open to air as much as possilbe - RTC if any redness, swelling, drainage, fever   Olean Reeeborah Gessner, FNP-BC  [facility]  02/05/2016 10:03 AM f

## 2017-03-04 ENCOUNTER — Ambulatory Visit (INDEPENDENT_AMBULATORY_CARE_PROVIDER_SITE_OTHER): Payer: Medicare Other | Admitting: Neurology

## 2017-03-04 ENCOUNTER — Encounter: Payer: Self-pay | Admitting: Neurology

## 2017-03-04 ENCOUNTER — Encounter (INDEPENDENT_AMBULATORY_CARE_PROVIDER_SITE_OTHER): Payer: Self-pay

## 2017-03-04 VITALS — BP 158/78 | HR 75 | Ht 62.0 in | Wt 138.6 lb

## 2017-03-04 DIAGNOSIS — F0391 Unspecified dementia with behavioral disturbance: Secondary | ICD-10-CM

## 2017-03-04 DIAGNOSIS — R410 Disorientation, unspecified: Secondary | ICD-10-CM | POA: Diagnosis not present

## 2017-03-04 DIAGNOSIS — E538 Deficiency of other specified B group vitamins: Secondary | ICD-10-CM

## 2017-03-04 MED ORDER — SERTRALINE HCL 50 MG PO TABS: 50.0000 mg | ORAL_TABLET | Freq: Every day | ORAL | 6 refills | Status: DC

## 2017-03-04 NOTE — Patient Instructions (Addendum)
Remember to drink plenty of fluid, eat healthy meals and do not skip any meals. Try to eat protein with a every meal and eat a healthy snack such as fruit or nuts in between meals. Try to keep a regular sleep-wake schedule and try to exercise daily, particularly in the form of walking, 20-30 minutes a day, if you can.   As far as your medications are concerned, I would like to suggest:  Start Zoloft 50mg  at bedtime then can increase to 100mg  if tolerated and needed email me in 4-6 weeks At next appointment we will start Aricept(Donepezil) for dementia  As far as diagnostic testing: Labs, MRI brain  I would like to see you back in 3 months, sooner if we need to. Please call us with any interim questions, concerns, problems, updates or refill requests.   Our phone number is [phone]. We also have an after hours call service for urgent matters and there is a physician on-call for urgent questions. For any emergencies you know to call 911 or go to the nearest emergency room  Alzheimer Disease Caregiver Guide Alzheimer disease is an illness that affects a person's brain. It causes a person to lose the ability to remember things and make good decisions. As the disease progresses, the person is unable to take care of himself or herself and needs more and more help to do simple tasks. Taking care of someone with Alzheimer disease can be very challenging and overwhelming. Memory loss and confusion Memory loss and confusion is mild in the beginning stages of the disease. Both of these problems become more severe as the disease progresses. Eventually, the person will not recognize places or even close family members and friends.  Stay calm.  Respond with a short explanation. Long explanations can be overwhelming and confusing.  Avoid corrections that sound like scolding.  Try not to take it personally, even if the person forgets your name.  Behavior changes Behavior changes are part of the  disease. The person may develop depression, anxiety, anger, hallucinations, or other behavior changes. These changes can come on suddenly and may be in response to pain, infection, changes in the environment (temperature, noise), overstimulation, or feeling lost or scared.  Try not to take behavior changes personally.  Remain calm and patient.  Do not argue or try to convince the person about a specific point. This will only make him or her more agitated.  Know that the behavior changes are part of the disease process and try to work through it.  Tips to reduce frustration  Schedule wisely by making appointments and doing daily tasks, like bathing and dressing, when the person is at his or her best.  Take your time. Simple tasks may take a lot longer, so be sure to allow for plenty of time.  Limit choices. Too many choices can be overwhelming and stressful for the person.  Involve the person in what you are doing.  Stick to a routine.  Avoid new or crowded situations, if possible.  Use simple words, short sentences, and a calm voice. Only give one direction at a time.  Buy clothes and shoes that are easy to put on and take off.  Let people help if they offer. Home safety Keeping the home safe is very important to reduce the risk of falls and injuries.  Keep floors clear of clutter. Remove rugs, magazine racks, and floor lamps.  Keep hallways well lit.  Put a handrail and nonslip mat in the  bathtub or shower.  Put childproof locks on cabinets with dangerous items, such as medicine, alcohol, guns, toxic cleaning items, sharp tools or utensils, matches, or lighters.  Place locks on doors where the person cannot easily see or reach them. This helps ensure that the person cannot wander out of the house and get lost.  Be prepared for emergencies. Keep a list of emergency phone numbers and addresses in a convenient area.  Plans for the future  Do not put off talking about  finances. ? Talk about money management. People with Alzheimer disease have trouble managing their money as the disease gets worse. ? Get help from professional advisors regarding financial and legal matters.  Do not put off talking about future care. ? Choose a power of attorney. This is someone who can make decisions for the person with Alzheimer disease when he or she is no longer able to do so. ? Talk about driving and when it is the right time to stop. The person's health care provider can help give advice on this matter. ? Talk about the person's living situation. If he or she lives alone, you need to make sure he or she is safe. Some people need extra help at home, and others need more care at a nursing home or care center. Support groups Joining a support group can be very helpful for caregivers of people with Alzheimer disease. Some advantages to being part of a support group include:  Getting strategies to manage stress.  Sharing experiences with others.  Receiving emotional comfort and support.  Learning new caregiving skills as the disease progresses.  Knowing what community resources are available and taking advantage of them.  Contact a health care provider if:  The person has a fever.  The person has a sudden change in behavior that does not improve with calming strategies.  The person is unable to manage in his or her current living situation.  The person threatens you or anyone else, including himself or herself.  You are no longer able to care for the person. This information is not intended to replace advice given to you by your health care provider. Make sure you discuss any questions you have with your health care provider. Document Released: 05/25/2004 Document Revised: 02/25/2016 Document Reviewed: 10/20/2011 Elsevier Interactive Patient Education  2017 [employer]   Sertraline tablets What is this medicine? SERTRALINE (SER tra leen) is used to treat  depression. It may also be used to treat obsessive compulsive disorder, panic disorder, post-trauma stress, premenstrual dysphoric disorder (PMDD) or social anxiety. This medicine may be used for other purposes; ask your health care provider or pharmacist if you have questions. COMMON BRAND NAME(S): Zoloft What should I tell my health care provider before I take this medicine? They need to know if you have any of these conditions: -bleeding disorders -bipolar disorder or a family history of bipolar disorder -glaucoma -heart disease -high blood pressure -history of irregular heartbeat -history of low levels of calcium, magnesium, or potassium in the blood -if you often drink alcohol -liver disease -receiving electroconvulsive therapy -seizures -suicidal thoughts, plans, or attempt; a previous suicide attempt by you or a family member -take medicines that treat or prevent blood clots -thyroid disease -an unusual or allergic reaction to sertraline, other medicines, foods, dyes, or preservatives -pregnant or trying to get pregnant -breast-feeding How should I use this medicine? Take this medicine by mouth with a glass of water. Follow the directions on the prescription label. You  can take it with or without food. Take your medicine at regular intervals. Do not take your medicine more often than directed. Do not stop taking this medicine suddenly except upon the advice of your doctor. Stopping this medicine too quickly may cause serious side effects or your condition may worsen. A special MedGuide will be given to you by the pharmacist with each prescription and refill. Be sure to read this information carefully each time. Talk to your pediatrician regarding the use of this medicine in children. While this drug may be prescribed for children as young as 7 years for selected conditions, precautions do apply. Overdosage: If you think you have taken too much of this medicine contact a poison  control center or emergency room at once. NOTE: This medicine is only for you. Do not share this medicine with others. What if I miss a dose? If you miss a dose, take it as soon as you can. If it is almost time for your next dose, take only that dose. Do not take double or extra doses. What may interact with this medicine? Do not take this medicine with any of the following medications: -cisapride -dofetilide -dronedarone -linezolid -MAOIs like Carbex, Eldepryl, Marplan, Nardil, and Parnate -methylene blue (injected into a vein) -pimozide -thioridazine This medicine may also interact with the following medications: -alcohol -amphetamines -aspirin and aspirin-like medicines -certain medicines for depression, anxiety, or psychotic disturbances -certain medicines for fungal infections like ketoconazole, fluconazole, posaconazole, and itraconazole -certain medicines for irregular heart beat like flecainide, quinidine, propafenone -certain medicines for migraine headaches like almotriptan, eletriptan, frovatriptan, naratriptan, rizatriptan, sumatriptan, zolmitriptan -certain medicines for sleep -certain medicines for seizures like carbamazepine, valproic acid, phenytoin -certain medicines that treat or prevent blood clots like warfarin, enoxaparin, dalteparin -cimetidine -digoxin -diuretics -fentanyl -isoniazid -lithium -NSAIDs, medicines for pain and inflammation, like ibuprofen or naproxen -other medicines that prolong the QT interval (cause an abnormal heart rhythm) -rasagiline -safinamide -supplements like St. John's wort, kava kava, valerian -tolbutamide -tramadol -tryptophan This list may not describe all possible interactions. Give your health care provider a list of all the medicines, herbs, non-prescription drugs, or dietary supplements you use. Also tell them if you smoke, drink alcohol, or use illegal drugs. Some items may interact with your medicine. What should I watch  for while using this medicine? Tell your doctor if your symptoms do not get better or if they get worse. Visit your doctor or health care professional for regular checks on your progress. Because it may take several weeks to see the full effects of this medicine, it is important to continue your treatment as prescribed by your doctor. Patients and their families should watch out for new or worsening thoughts of suicide or depression. Also watch out for sudden changes in feelings such as feeling anxious, agitated, panicky, irritable, hostile, aggressive, impulsive, severely restless, overly excited and hyperactive, or not being able to sleep. If this happens, especially at the beginning of treatment or after a change in dose, call your health care professional. Bonita QuinYou may get drowsy or dizzy. Do not drive, use machinery, or do anything that needs mental alertness until you know how this medicine affects you. Do not stand or sit up quickly, especially if you are an older patient. This reduces the risk of dizzy or fainting spells. Alcohol may interfere with the effect of this medicine. Avoid alcoholic drinks. Your mouth may get dry. Chewing sugarless gum or sucking hard candy, and drinking plenty of water may help. Contact your  doctor if the problem does not go away or is severe. What side effects may I notice from receiving this medicine? Side effects that you should report to your doctor or health care professional as soon as possible: -allergic reactions like skin rash, itching or hives, swelling of the face, lips, or tongue -anxious -black, tarry stools -changes in vision -confusion -elevated mood, decreased need for sleep, racing thoughts, impulsive behavior -eye pain -fast, irregular heartbeat -feeling faint or lightheaded, falls -feeling agitated, angry, or irritable -hallucination, loss of contact with reality -loss of balance or coordination -loss of memory -painful or prolonged  erections -restlessness, pacing, inability to keep still -seizures -stiff muscles -suicidal thoughts or other mood changes -trouble sleeping -unusual bleeding or bruising -unusually weak or tired -vomiting Side effects that usually do not require medical attention (report to your doctor or health care professional if they continue or are bothersome): -change in appetite or weight -change in sex drive or performance -diarrhea -increased sweating -indigestion, nausea -tremors This list may not describe all possible side effects. Call your doctor for medical advice about side effects. You may report side effects to FDA at 1-800-FDA-1088. Where should I keep my medicine? Keep out of the reach of children. Store at room temperature between 15 and 30 degrees C (59 and 86 degrees F). Throw away any unused medicine after the expiration date. NOTE: This sheet is a summary. It may not cover all possible information. If you have questions about this medicine, talk to your doctor, pharmacist, or health care provider.  2018 Elsevier/Gold Standard (2016-09-17 14:17:49)  Donepezil tablets What is this medicine? DONEPEZIL (doe NEP e zil) is used to treat mild to moderate dementia caused by Alzheimer's disease. This medicine may be used for other purposes; ask your health care provider or pharmacist if you have questions. COMMON BRAND NAME(S): Aricept What should I tell my health care provider before I take this medicine? They need to know if you have any of these conditions: -asthma or other lung disease -difficulty passing urine -head injury -heart disease -history of irregular heartbeat -liver disease -seizures (convulsions) -stomach or intestinal disease, ulcers or stomach bleeding -an unusual or allergic reaction to donepezil, other medicines, foods, dyes, or preservatives -pregnant or trying to get pregnant -breast-feeding How should I use this medicine? Take this medicine by mouth  with a glass of water. Follow the directions on the prescription label. You may take this medicine with or without food. Take this medicine at regular intervals. This medicine is usually taken before bedtime. Do not take it more often than directed. Continue to take your medicine even if you feel better. Do not stop taking except on your doctor's advice. If you are taking the 23 mg donepezil tablet, swallow it whole; do not cut, crush, or chew it. Talk to your pediatrician regarding the use of this medicine in children. Special care may be needed. Overdosage: If you think you have taken too much of this medicine contact a poison control center or emergency room at once. NOTE: This medicine is only for you. Do not share this medicine with others. What if I miss a dose? If you miss a dose, take it as soon as you can. If it is almost time for your next dose, take only that dose, do not take double or extra doses. What may interact with this medicine? Do not take this medicine with any of the following medications: -certain medicines for fungal infections like itraconazole, fluconazole, posaconazole, and voriconazole -  cisapride -dextromethorphan; quinidine -dofetilide -dronedarone -pimozide -quinidine -thioridazine -ziprasidone This medicine may also interact with the following medications: -antihistamines for allergy, cough and cold -atropine -bethanechol -carbamazepine -certain medicines for bladder problems like oxybutynin, tolterodine -certain medicines for Parkinson's disease like benztropine, trihexyphenidyl -certain medicines for stomach problems like dicyclomine, hyoscyamine -certain medicines for travel sickness like scopolamine -dexamethasone -ipratropium -NSAIDs, medicines for pain and inflammation, like ibuprofen or naproxen -other medicines for Alzheimer's disease -other medicines that prolong the QT interval (cause an abnormal heart  rhythm) -phenobarbital -phenytoin -rifampin, rifabutin or rifapentine This list may not describe all possible interactions. Give your health care provider a list of all the medicines, herbs, non-prescription drugs, or dietary supplements you use. Also tell them if you smoke, drink alcohol, or use illegal drugs. Some items may interact with your medicine. What should I watch for while using this medicine? Visit your doctor or health care professional for regular checks on your progress. Check with your doctor or health care professional if your symptoms do not get better or if they get worse. You may get drowsy or dizzy. Do not drive, use machinery, or do anything that needs mental alertness until you know how this drug affects you. What side effects may I notice from receiving this medicine? Side effects that you should report to your doctor or health care professional as soon as possible: -allergic reactions like skin rash, itching or hives, swelling of the face, lips, or tongue -feeling faint or lightheaded, falls -loss of bladder control -seizures -signs and symptoms of a dangerous change in heartbeat or heart rhythm like chest pain; dizziness; fast or irregular heartbeat; palpitations; feeling faint or lightheaded, falls; breathing problems -signs and symptoms of infection like fever or chills; cough; sore throat; pain or trouble passing urine -signs and symptoms of liver injury like dark yellow or brown urine; general ill feeling or flu-like symptoms; light-colored stools; loss of appetite; nausea; right upper belly pain; unusually weak or tired; yellowing of the eyes or skin -slow heartbeat or palpitations -unusual bleeding or bruising -vomiting Side effects that usually do not require medical attention (report to your doctor or health care professional if they continue or are bothersome): -diarrhea, especially when starting treatment -headache -loss of appetite -muscle  cramps -nausea -stomach upset This list may not describe all possible side effects. Call your doctor for medical advice about side effects. You may report side effects to FDA at 1-800-FDA-1088. Where should I keep my medicine? Keep out of reach of children. Store at room temperature between 15 and 30 degrees C (59 and 86 degrees F). Throw away any unused medicine after the expiration date. NOTE: This sheet is a summary. It may not cover all possible information. If you have questions about this medicine, talk to your doctor, pharmacist, or health care provider.  2018 Elsevier/Gold Standard (2016-03-01 21:00:42)
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
## 2017-03-06 ENCOUNTER — Encounter: Payer: Self-pay | Admitting: Neurology

## 2017-03-08 ENCOUNTER — Telehealth: Payer: Self-pay | Admitting: Neurology

## 2017-03-08 LAB — CBC
HEMATOCRIT: 39.5 % (ref 34.0–46.6)
Hemoglobin: 13 g/dL (ref 11.1–15.9)
MCH: 29 pg (ref 26.6–33.0)
MCHC: 32.9 g/dL (ref 31.5–35.7)
MCV: 88 fL (ref 79–97)
PLATELETS: 297 10*3/uL (ref 150–379)
RBC: 4.48 x10E6/uL (ref 3.77–5.28)
RDW: 13.9 % (ref 12.3–15.4)
WBC: 9.6 10*3/uL (ref 3.4–10.8)

## 2017-03-08 LAB — COMPREHENSIVE METABOLIC PANEL
ALK PHOS: 46 IU/L (ref 39–117)
ALT: 20 IU/L (ref 0–32)
AST: 38 IU/L (ref 0–40)
Albumin/Globulin Ratio: 1.7 (ref 1.2–2.2)
Albumin: 4.3 g/dL (ref 3.2–4.6)
BUN/Creatinine Ratio: 13 (ref 12–28)
BUN: 11 mg/dL (ref 10–36)
Bilirubin Total: 0.6 mg/dL (ref 0.0–1.2)
CO2: 27 mmol/L (ref 18–29)
CREATININE: 0.82 mg/dL (ref 0.57–1.00)
Calcium: 9.7 mg/dL (ref 8.7–10.3)
Chloride: 95 mmol/L — ABNORMAL LOW (ref 96–106)
GFR calc Af Amer: 73 mL/min/{1.73_m2} (ref >59)
GFR calc non Af Amer: 63 mL/min/{1.73_m2} (ref >59)
GLUCOSE: 93 mg/dL (ref 65–99)
Globulin, Total: 2.6 g/dL (ref 1.5–4.5)
Potassium: 4.3 mmol/L (ref 3.5–5.2)
Sodium: 137 mmol/L (ref 134–144)
Total Protein: 6.9 g/dL (ref 6.0–8.5)

## 2017-03-08 LAB — B12 AND FOLATE PANEL
Folate: >20 ng/mL (ref >3.0)
Vitamin B-12: 661 pg/mL (ref 232–1245)

## 2017-03-08 LAB — METHYLMALONIC ACID, SERUM: Methylmalonic Acid: 162 nmol/L (ref 0–378)

## 2017-03-08 LAB — VITAMIN B6: Vitamin B6: 165.5 ug/L — ABNORMAL HIGH (ref 2.0–32.8)

## 2017-03-08 LAB — AMMONIA: Ammonia: 29 ug/dL (ref 19–87)

## 2017-03-08 LAB — RPR: RPR: NONREACTIVE

## 2017-03-08 LAB — TSH: TSH: 1.72 u[IU]/mL (ref 0.450–4.500)

## 2017-03-08 LAB — VITAMIN B1: THIAMINE: 151.1 nmol/L (ref 66.5–200.0)

## 2017-03-08 NOTE — Telephone Encounter (Signed)
Irving BurtonEmily, I ordered an MRi brain for this patient but her son should be called not her and I think it is only patient's number on record. Patient has dementia. Can you make sure her son is called not her to schedule - [phone] thanks!

## 2017-03-09 NOTE — Telephone Encounter (Signed)
Okay that is no problem I will leave [facility] a note to contact her son instead.

## 2017-03-25 ENCOUNTER — Ambulatory Visit
Admission: RE | Admit: 2017-03-25 | Discharge: 2017-03-25 | Disposition: A | Discharge status: Home / Self Care | Payer: Medicare Other | Source: Ambulatory Visit | Attending: Neurology | Admitting: Neurology

## 2017-03-25 DIAGNOSIS — R410 Disorientation, unspecified: Secondary | ICD-10-CM | POA: Diagnosis not present

## 2017-03-25 DIAGNOSIS — F0391 Unspecified dementia with behavioral disturbance: Secondary | ICD-10-CM | POA: Diagnosis not present

## 2017-04-07 ENCOUNTER — Telehealth: Payer: Self-pay

## 2017-04-07 ENCOUNTER — Telehealth: Payer: Self-pay | Admitting: *Deleted

## 2017-04-07 NOTE — Telephone Encounter (Signed)
-----   Message from Anson FretAntonia B Ahern, MD sent at 03/31/2017 11:37 AM EDT ----- Mri of the brain did not show any lesions or masses or stroke. However it did show atrophy that is more severe in the mesial temporal areas that is suggestive of alzheimers tyoe dementia. thanks

## 2017-04-07 NOTE — Telephone Encounter (Signed)
Called pt's son w/ MRI results and advised to start donepezil as prescribed by PCP and sertraline as ordered. Call back w/ any questions/concerns and if/when meds need to be increased.

## 2017-04-07 NOTE — Telephone Encounter (Signed)
Left message for her son on HIPAA to return my call Linford Arnold(Randy Roughton [phone]).

## 2017-04-07 NOTE — Telephone Encounter (Signed)
-----   Message from Antonia B Ahern, MD sent at 03/31/2017 11:37 AM EDT ----- Mri of the brain did not show any lesions or masses or stroke. However it did show atrophy that is more severe in the mesial temporal areas that is suggestive of alzheimers tyoe dementia. thanks 

## 2017-04-07 NOTE — Telephone Encounter (Signed)
Patients son Theresa Cross (listed on DPR) called office returning RN's call.  Please call

## 2017-04-07 NOTE — Telephone Encounter (Signed)
Received 04/06/17 notes from [facility] of pt's lab results and annual physical at which daughter reported that pt's "nervous spells are getting worse, anytime she goes to the doctor or church." Dr. Felipa EthAvva notes that memory loss is a "significant issue, incapable of living independently, overseen by daughter in the same house as well as 2 sons remotely, doing relatively well, h/o significant agitation, now using Depakote as well as mood stabilizer sertraline, appears relatively well compensated per daughter's report, however we'll add Aricept at 5 mg, patient scheduled to see Dr. Lucia GaskinsAhern next month. May need to escalate to 10 mg, potential side effects and expectations discussed."   Labs showed positive UA and culture. She has a h/o hypertonicity of bladder. Per Dr. Felipa EthAvva, "Pt was historically given samples of Mybetriq w/o significant improvement, defers any additional medications given risk of side effects... We'll not treat any abnormal culture results unless w/ significant symptoms. Known hx of IBS, h/o being aggravated by antibx." CMP, CBC, lipid panel, TSH and vitamin D levels all w/in normal limits except RDW 12.1 (L), MPV 6.2 (L), HDL 77 (H), LDL/HDL ration 1.4 (L). Sent to med records for scanning, copy to Dr. Lucia GaskinsAhern for review.

## 2017-04-08 MED ORDER — SERTRALINE HCL 100 MG PO TABS: 100.0000 mg | ORAL_TABLET | Freq: Every day | ORAL | 11 refills | Status: AC

## 2017-04-08 NOTE — Telephone Encounter (Signed)
Returned son's call. Asked that he call back if he'd like to further discuss MRI results or w/ any questions.

## 2017-04-08 NOTE — Telephone Encounter (Signed)
Talked to pt's son and reviewed MRI results. He reports that pt has been a little more agitated/frustrated such as when she's having difficulty using something like the microwave. Said that she has started donepezil as prescribed by PCP. Discussed increasing sertraline per Dr. Trevor MaceAhern's OV note and he is agreeable to this. New rx for 100 mg e-scribed to pharmacy. Son says that he will share info w/ his sister but that they will call back w/ any problems or questions.

## 2017-04-08 NOTE — Addendum Note (Signed)
Addended by: Donnelly AngelicaHOGAN, Phoenyx Paulsen L on: 04/08/2017 12:06 PM   Modules accepted: Orders

## 2017-04-19 ENCOUNTER — Emergency Department (HOSPITAL_COMMUNITY): Payer: Medicare Other

## 2017-04-19 ENCOUNTER — Encounter (HOSPITAL_COMMUNITY): Payer: Self-pay | Admitting: Emergency Medicine

## 2017-04-19 ENCOUNTER — Encounter: Payer: Self-pay | Admitting: Neurology

## 2017-04-19 ENCOUNTER — Inpatient Hospital Stay (HOSPITAL_COMMUNITY): Payer: Medicare Other

## 2017-04-19 ENCOUNTER — Other Ambulatory Visit (HOSPITAL_COMMUNITY): Payer: Medicare Other

## 2017-04-19 ENCOUNTER — Inpatient Hospital Stay (HOSPITAL_COMMUNITY)
Admission: EM | Admit: 2017-04-19 | Discharge: 2017-04-22 | DRG: 085 | Disposition: A | Discharge status: Hospice | Payer: Medicare Other | Attending: Neurosurgery | Admitting: Neurosurgery

## 2017-04-19 DIAGNOSIS — Z515 Encounter for palliative care: Secondary | ICD-10-CM | POA: Diagnosis not present

## 2017-04-19 DIAGNOSIS — Z90710 Acquired absence of both cervix and uterus: Secondary | ICD-10-CM

## 2017-04-19 DIAGNOSIS — G8194 Hemiplegia, unspecified affecting left nondominant side: Secondary | ICD-10-CM | POA: Diagnosis present

## 2017-04-19 DIAGNOSIS — S065X0A Traumatic subdural hemorrhage without loss of consciousness, initial encounter: Secondary | ICD-10-CM | POA: Diagnosis present

## 2017-04-19 DIAGNOSIS — M1990 Unspecified osteoarthritis, unspecified site: Secondary | ICD-10-CM | POA: Diagnosis present

## 2017-04-19 DIAGNOSIS — R402352 Coma scale, best motor response, localizes pain, at arrival to emergency department: Secondary | ICD-10-CM | POA: Diagnosis present

## 2017-04-19 DIAGNOSIS — Z888 Allergy status to other drugs, medicaments and biological substances status: Secondary | ICD-10-CM

## 2017-04-19 DIAGNOSIS — E871 Hypo-osmolality and hyponatremia: Secondary | ICD-10-CM | POA: Diagnosis present

## 2017-04-19 DIAGNOSIS — R402142 Coma scale, eyes open, spontaneous, at arrival to emergency department: Secondary | ICD-10-CM | POA: Diagnosis present

## 2017-04-19 DIAGNOSIS — G3183 Dementia with Lewy bodies: Secondary | ICD-10-CM | POA: Diagnosis not present

## 2017-04-19 DIAGNOSIS — M810 Age-related osteoporosis without current pathological fracture: Secondary | ICD-10-CM | POA: Diagnosis present

## 2017-04-19 DIAGNOSIS — R1310 Dysphagia, unspecified: Secondary | ICD-10-CM | POA: Diagnosis not present

## 2017-04-19 DIAGNOSIS — I609 Nontraumatic subarachnoid hemorrhage, unspecified: Secondary | ICD-10-CM

## 2017-04-19 DIAGNOSIS — Z881 Allergy status to other antibiotic agents status: Secondary | ICD-10-CM

## 2017-04-19 DIAGNOSIS — I10 Essential (primary) hypertension: Secondary | ICD-10-CM

## 2017-04-19 DIAGNOSIS — R4182 Altered mental status, unspecified: Secondary | ICD-10-CM

## 2017-04-19 DIAGNOSIS — Z885 Allergy status to narcotic agent status: Secondary | ICD-10-CM

## 2017-04-19 DIAGNOSIS — E876 Hypokalemia: Secondary | ICD-10-CM

## 2017-04-19 DIAGNOSIS — S066X9A Traumatic subarachnoid hemorrhage with loss of consciousness of unspecified duration, initial encounter: Secondary | ICD-10-CM | POA: Diagnosis present

## 2017-04-19 DIAGNOSIS — R402212 Coma scale, best verbal response, none, at arrival to emergency department: Secondary | ICD-10-CM | POA: Diagnosis present

## 2017-04-19 DIAGNOSIS — S066X0A Traumatic subarachnoid hemorrhage without loss of consciousness, initial encounter: Secondary | ICD-10-CM | POA: Diagnosis present

## 2017-04-19 DIAGNOSIS — F0390 Unspecified dementia without behavioral disturbance: Secondary | ICD-10-CM | POA: Diagnosis present

## 2017-04-19 DIAGNOSIS — R4701 Aphasia: Secondary | ICD-10-CM | POA: Diagnosis present

## 2017-04-19 DIAGNOSIS — Y92013 Bedroom of single-family (private) house as the place of occurrence of the external cause: Secondary | ICD-10-CM | POA: Diagnosis not present

## 2017-04-19 DIAGNOSIS — Z7189 Other specified counseling: Secondary | ICD-10-CM | POA: Diagnosis not present

## 2017-04-19 DIAGNOSIS — Z0189 Encounter for other specified special examinations: Secondary | ICD-10-CM

## 2017-04-19 DIAGNOSIS — S066XAA Traumatic subarachnoid hemorrhage with loss of consciousness status unknown, initial encounter: Secondary | ICD-10-CM | POA: Diagnosis present

## 2017-04-19 DIAGNOSIS — Z809 Family history of malignant neoplasm, unspecified: Secondary | ICD-10-CM | POA: Diagnosis not present

## 2017-04-19 DIAGNOSIS — Z66 Do not resuscitate: Secondary | ICD-10-CM | POA: Diagnosis present

## 2017-04-19 DIAGNOSIS — G9340 Encephalopathy, unspecified: Secondary | ICD-10-CM

## 2017-04-19 DIAGNOSIS — Z8249 Family history of ischemic heart disease and other diseases of the circulatory system: Secondary | ICD-10-CM | POA: Diagnosis not present

## 2017-04-19 DIAGNOSIS — S066X0D Traumatic subarachnoid hemorrhage without loss of consciousness, subsequent encounter: Secondary | ICD-10-CM | POA: Diagnosis not present

## 2017-04-19 DIAGNOSIS — W06XXXA Fall from bed, initial encounter: Secondary | ICD-10-CM | POA: Diagnosis present

## 2017-04-19 DIAGNOSIS — F0280 Dementia in other diseases classified elsewhere without behavioral disturbance: Secondary | ICD-10-CM | POA: Diagnosis not present

## 2017-04-19 LAB — COMPREHENSIVE METABOLIC PANEL
ALK PHOS: 43 U/L (ref 38–126)
ALT: 19 U/L (ref 14–54)
ALT: 21 U/L (ref 14–54)
ANION GAP: 7 (ref 5–15)
ANION GAP: 13 (ref 5–15)
AST: 35 U/L (ref 15–41)
AST: 34 U/L (ref 15–41)
Albumin: 3.8 g/dL (ref 3.5–5.0)
Albumin: 3.9 g/dL (ref 3.5–5.0)
Alkaline Phosphatase: 40 U/L (ref 38–126)
BILIRUBIN TOTAL: 0.5 mg/dL (ref 0.3–1.2)
BUN: 10 mg/dL (ref 6–20)
BUN: 10 mg/dL (ref 6–20)
CALCIUM: 9 mg/dL (ref 8.9–10.3)
CALCIUM: 9 mg/dL (ref 8.9–10.3)
CO2: 27 mmol/L (ref 22–32)
CO2: 22 mmol/L (ref 22–32)
CREATININE: 0.71 mg/dL (ref 0.44–1.00)
Chloride: 95 mmol/L — ABNORMAL LOW (ref 101–111)
Chloride: 95 mmol/L — ABNORMAL LOW (ref 101–111)
Creatinine, Ser: 0.64 mg/dL (ref 0.44–1.00)
GFR calc non Af Amer: >60 mL/min (ref >60)
GFR calc non Af Amer: >60 mL/min (ref >60)
Glucose, Bld: 122 mg/dL — ABNORMAL HIGH (ref 65–99)
Glucose, Bld: 136 mg/dL — ABNORMAL HIGH (ref 65–99)
Potassium: 4 mmol/L (ref 3.5–5.1)
Potassium: 3.8 mmol/L (ref 3.5–5.1)
SODIUM: 129 mmol/L — AB (ref 135–145)
SODIUM: 130 mmol/L — AB (ref 135–145)
TOTAL PROTEIN: 6.6 g/dL (ref 6.5–8.1)
Total Bilirubin: 0.7 mg/dL (ref 0.3–1.2)
Total Protein: 7.1 g/dL (ref 6.5–8.1)

## 2017-04-19 LAB — URINALYSIS, ROUTINE W REFLEX MICROSCOPIC
BILIRUBIN URINE: NEGATIVE
Glucose, UA: NEGATIVE mg/dL
HGB URINE DIPSTICK: NEGATIVE
KETONES UR: NEGATIVE mg/dL
Leukocytes, UA: NEGATIVE
NITRITE: NEGATIVE
PH: 8 (ref 5.0–8.0)
Protein, ur: NEGATIVE mg/dL
SPECIFIC GRAVITY, URINE: 1.009 (ref 1.005–1.030)

## 2017-04-19 LAB — CBC WITH DIFFERENTIAL/PLATELET
Basophils Absolute: 0 10*3/uL (ref 0.0–0.1)
Basophils Relative: 0 %
Eosinophils Absolute: 0.1 10*3/uL (ref 0.0–0.7)
Eosinophils Relative: 1 %
HEMATOCRIT: 38.5 % (ref 36.0–46.0)
HEMOGLOBIN: 13 g/dL (ref 12.0–15.0)
LYMPHS ABS: 1.7 10*3/uL (ref 0.7–4.0)
LYMPHS PCT: 16 %
MCH: 28.5 pg (ref 26.0–34.0)
MCHC: 33.8 g/dL (ref 30.0–36.0)
MCV: 84.4 fL (ref 78.0–100.0)
MONOS PCT: 6 %
Monocytes Absolute: 0.6 10*3/uL (ref 0.1–1.0)
NEUTROS ABS: 8.2 10*3/uL — AB (ref 1.7–7.7)
NEUTROS PCT: 77 %
Platelets: 269 10*3/uL (ref 150–400)
RBC: 4.56 MIL/uL (ref 3.87–5.11)
RDW: 13.2 % (ref 11.5–15.5)
WBC: 10.5 10*3/uL (ref 4.0–10.5)

## 2017-04-19 LAB — CBC
HCT: 40.2 % (ref 36.0–46.0)
HEMOGLOBIN: 13.8 g/dL (ref 12.0–15.0)
MCH: 28.9 pg (ref 26.0–34.0)
MCHC: 34.3 g/dL (ref 30.0–36.0)
MCV: 84.3 fL (ref 78.0–100.0)
PLATELETS: 270 10*3/uL (ref 150–400)
RBC: 4.77 MIL/uL (ref 3.87–5.11)
RDW: 13.3 % (ref 11.5–15.5)
WBC: 12.6 10*3/uL — ABNORMAL HIGH (ref 4.0–10.5)

## 2017-04-19 LAB — PROTIME-INR
INR: 0.99
PROTHROMBIN TIME: 13.1 s (ref 11.4–15.2)

## 2017-04-19 LAB — I-STAT CG4 LACTIC ACID, ED
Lactic Acid, Venous: 0.73 mmol/L (ref 0.5–1.9)
Lactic Acid, Venous: 0.63 mmol/L (ref 0.5–1.9)

## 2017-04-19 LAB — MRSA PCR SCREENING: MRSA by PCR: NEGATIVE

## 2017-04-19 LAB — APTT: aPTT: 27 seconds (ref 24–36)

## 2017-04-19 MED ORDER — PANTOPRAZOLE SODIUM 40 MG IV SOLR
40.0000 mg | Freq: Every day | INTRAVENOUS | Status: DC
  Administered 2017-04-19 – 2017-04-21 (×3): 40 mg via INTRAVENOUS
  Filled 2017-04-19 (×3): qty 40

## 2017-04-19 MED ORDER — IOPAMIDOL (ISOVUE-370) INJECTION 76%
INTRAVENOUS | Status: AC
  Administered 2017-04-19: 50 mL
  Filled 2017-04-19: qty 50

## 2017-04-19 MED ORDER — SODIUM CHLORIDE 0.9 % IV SOLN
500.0000 mg | Freq: Two times a day (BID) | INTRAVENOUS | Status: DC
  Administered 2017-04-20 – 2017-04-22 (×5): 500 mg via INTRAVENOUS
  Filled 2017-04-19 (×9): qty 5

## 2017-04-19 MED ORDER — ORAL CARE MOUTH RINSE
15.0000 mL | Freq: Two times a day (BID) | OROMUCOSAL | Status: DC
  Administered 2017-04-19 – 2017-04-22 (×6): 15 mL via OROMUCOSAL

## 2017-04-19 MED ORDER — CLEVIDIPINE BUTYRATE 0.5 MG/ML IV EMUL
0.0000 mg/h | INTRAVENOUS | Status: DC
  Administered 2017-04-19: 1 mg/h via INTRAVENOUS
  Administered 2017-04-20: 2 mg/h via INTRAVENOUS
  Administered 2017-04-20 – 2017-04-21 (×3): 3 mg/h via INTRAVENOUS
  Filled 2017-04-19 (×6): qty 50

## 2017-04-19 MED ORDER — SODIUM CHLORIDE 0.9 % IV SOLN
INTRAVENOUS | Status: DC
Start: 2017-04-19 — End: 2017-04-22
  Administered 2017-04-19 – 2017-04-20 (×2): via INTRAVENOUS

## 2017-04-19 MED ORDER — ACETAMINOPHEN 325 MG PO TABS: 650.0000 mg | ORAL_TABLET | ORAL | Status: DC | PRN

## 2017-04-19 MED ORDER — ACETAMINOPHEN 160 MG/5ML PO SOLN: 650.0000 mg | ORAL | Status: DC | PRN

## 2017-04-19 MED ORDER — SENNOSIDES-DOCUSATE SODIUM 8.6-50 MG PO TABS
1.0000 | ORAL_TABLET | Freq: Two times a day (BID) | ORAL | Status: DC
  Administered 2017-04-20: 1 via ORAL
  Filled 2017-04-19 (×2): qty 1

## 2017-04-19 MED ORDER — STROKE: EARLY STAGES OF RECOVERY BOOK
Freq: Once | Status: DC
  Filled 2017-04-19: qty 1

## 2017-04-19 MED ORDER — ACETAMINOPHEN 650 MG RE SUPP: 650.0000 mg | RECTAL | Status: DC | PRN

## 2017-04-19 MED ORDER — LABETALOL HCL 5 MG/ML IV SOLN
20.0000 mg | Freq: Once | INTRAVENOUS | Status: AC
  Administered 2017-04-19: 20 mg via INTRAVENOUS
  Filled 2017-04-19: qty 4

## 2017-04-19 NOTE — ED Provider Notes (Signed)
[facility]-EMERGENCY DEPT Provider Note   CSN: [id number] Arrival date & time: 04/19/17  1217     History   Chief Complaint Chief Complaint  Patient presents with  . Altered Mental Status    HPI Samira Acero Shinault is a 83 y.o. female.  HPI Level V caveat due to aMS. 83 year old female brought in by EMS for altered mental status. History of arthritis and osteoporosis. No blood thinners. Fall out of bed this morning at 1 AM. EMS called and felt she was well. This morning at 11 AM family noted she was confused, non-verbal. At baseline very functional.    Past Medical History:  Diagnosis Date  . Allergy   . Arthritis   . Osteoporosis     Patient Active Problem List   Diagnosis Date Noted  . Need for Tdap vaccination 01/26/2016  . CONSTIPATION 07/02/2009  . IRRITABLE BOWEL SYNDROME 07/02/2009    Past Surgical History:  Procedure Laterality Date  . ABDOMINAL HYSTERECTOMY    . BREAST SURGERY    . EYE SURGERY      OB History    No data available       Home Medications    Prior to Admission medications   Medication Sig Start Date End Date Taking? Authorizing Provider  ALPRAZolam Prudy Feeler) 0.25 MG tablet  02/27/17   [provider]  divalproex (DEPAKOTE) 250 MG DR tablet  02/16/17   [provider]  sertraline (ZOLOFT) 100 MG tablet Take 1 tablet (100 mg total) by mouth at bedtime. 04/08/17   Anson Fret, MD  Triamcinolone Acetonide (TRIAMCINOLONE 0.1 % CREAM : EUCERIN) CREA Apply 1 application topically 2 (two) times daily. Apply twice a day to affected area. Mix is 240 g of triamcinolone with 240 g of Eucerin 12/03/15   Daub, Maylon Peppers, MD    Family History Family History  Problem Relation Age of Onset  . Cancer Mother   . Heart disease Mother   . Cancer Brother   . Cancer Brother     Social History Social History  Substance Use Topics  . Smoking status: Never Smoker  . Smokeless tobacco: Never Used  . Alcohol use No     Allergies     Amoxicillin; Codeine; Gabapentin; and Morphine   Review of Systems Review of Systems  Unable to perform ROS: Mental status change     Physical Exam Updated Vital Signs BP (!) 160/76   Pulse 72   Temp 99.2 F (37.3 C) (Rectal)   Resp 17   SpO2 99%   Physical Exam Physical Exam  Nursing note and vitals reviewed. Constitutional: Well developed, non-toxic, and in no acute distress Head: Normocephalic and atraumatic.  Mouth/Throat: Oropharynx is clear and moist.  Neck: Normal range of motion. Neck supple. cervical collar in place. Cardiovascular: Normal rate and regular rhythm.   Pulmonary/Chest: Effort normal and breath sounds normal.  Abdominal: Soft. There is no tenderness. There is no rebound and no guarding.  Musculoskeletal: Normal range of motion.  Neurological: Alert, no facial droop, nonverbal, does not obey commands. Moves bilateral RUE when lifted in the air. Up going babinski's bilaterally. Skin: Skin is warm and dry.  Psychiatric: Cooperative   ED Treatments / Results  Labs (all labs ordered are listed, but only abnormal results are displayed) Labs Reviewed  CBC WITH DIFFERENTIAL/PLATELET - Abnormal; Notable for the following:       Result Value   Neutro Abs 8.2 (*)    All other components  within normal limits  COMPREHENSIVE METABOLIC PANEL - Abnormal; Notable for the following:    Sodium 129 (*)    Chloride 95 (*)    Glucose, Bld 122 (*)    All other components within normal limits  URINALYSIS, ROUTINE W REFLEX MICROSCOPIC - Abnormal; Notable for the following:    APPearance CLOUDY (*)    All other components within normal limits  PROTIME-INR  APTT  I-STAT CG4 LACTIC ACID, ED  I-STAT CG4 LACTIC ACID, ED    EKG  EKG Interpretation None       Radiology Ct Angio Head W Or Wo Contrast  Result Date: 04/19/2017 CLINICAL DATA:  Continued surveillance intracranial bleed. Fell during the night. Nonverbal. EXAM: CT ANGIOGRAPHY HEAD AND NECK  TECHNIQUE: Multidetector CT imaging of the head and neck was performed using the standard protocol during bolus administration of intravenous contrast. Multiplanar CT image reconstructions and MIPs were obtained to evaluate the vascular anatomy. Carotid stenosis measurements (when applicable) are obtained utilizing NASCET criteria, using the distal internal carotid diameter as the denominator. CONTRAST:  Isovue 370, 50 mL. COMPARISON:  CT head and C-spine earlier today. FINDINGS: CTA NECK Aortic arch: Standard branching. Imaged portion shows no evidence of aneurysm or dissection. No significant stenosis of the major arch vessel origins. Right carotid system: Minor atheromatous change. No evidence of dissection, stenosis (50% or greater) or occlusion. Left carotid system: Minor atheromatous change. No evidence of dissection, stenosis (50% or greater) or occlusion. Vertebral arteries: LEFT vertebral dominant. No evidence of dissection, stenosis (50% or greater) or occlusion. Nonvascular soft tissues: No lung apex lesion. Developing pulmonary edema. No neck mass. Cervical spondylosis. CTA HEAD Anterior circulation: Minor atheromatous change of the skull base and supraclinoid internal carotid arteries, predominantly calcific. No proximal stenosis of the anterior or middle cerebral arteries. The anterior cerebral arteries are visualized throughout their A2 and A3 segments up to and beyond the large midline intracranial hemorrhage. No aneurysm is seen. There is no evidence for significant vasculitis. No evidence for vascular malformation. Posterior circulation: LEFT vertebral dominant but both contribute to basilar formation. No significant stenosis. No saccular aneurysm. Venous sinuses: As permitted by contrast timing, patent. Anatomic variants: None of significance. Delayed phase:   No abnormal intracranial enhancement. Comparison with the previous MR from 03/25/2017 shows no underlying abnormality prior to the bleed.  Compared with the earlier CT, the hemorrhage is no larger. IMPRESSION: No arterial or venous abnormality is seen to suggest underlying cause for the observed parenchymal, subarachnoid, and intraventricular hemorrhage. Given the history of fall, the observed findings may be posttraumatic in origin. Minor extracranial and intracranial atherosclerotic disease, without flow reducing lesion or dissection. Electronically Signed   By: Elsie StainJohn T Curnes M.D.   On: 04/19/2017 15:21   Ct Head Wo Contrast  Result Date: 04/19/2017 CLINICAL DATA:  Larey SeatFell during the night. Altered mental status this morning. Nonverbal. Initial encounter. EXAM: CT HEAD WITHOUT CONTRAST CT CERVICAL SPINE WITHOUT CONTRAST TECHNIQUE: Multidetector CT imaging of the head and cervical spine was performed following the standard protocol without intravenous contrast. Multiplanar CT image reconstructions of the cervical spine were also generated. COMPARISON:  Brain MRI 03/25/2017. Cervical spine radiographs 02/02/2008. FINDINGS: CT HEAD FINDINGS Brain: There is a large volume of acute subarachnoid hemorrhage. A discrete hematoma in the pericallosal region measures 3.7 x 4.7 x 1.8 cm. Subarachnoid hemorrhage is present in multiple sulci along the interhemispheric fissure bilaterally as well as in the predominantly right sylvian fissure. There is intraventricular hemorrhage in the  left greater than right lateral ventricles, third ventricle, and fourth ventricle. There is no significant ventricular enlargement compared to the prior MRI to indicate acute hydrocephalus. A small acute subdural hematoma over left frontal convexity measures 6 mm in thickness. No acute large territory infarct is seen. There is no midline shift. Mild cerebral atrophy is noted. Cerebral white matter hypodensities are nonspecific but compatible with chronic small vessel ischemic disease, mild for age. Vascular: Calcified atherosclerosis at the skullbase. Skull: No fracture or focal  osseous lesion. Sinuses/Orbits: Clear paranasal sinuses. Small chronic left mastoid effusion. Prior right mastoidectomy, canal wall up. Bilateral cataract extraction. Other: None. CT CERVICAL SPINE FINDINGS Alignment: Minimal anterolisthesis of C6 on C7 which appears degenerative. Straightening of the normal cervical lordosis. Skull base and vertebrae: No acute fracture or destructive osseous process is identified. Soft tissues and spinal canal: No prevertebral fluid or swelling. Disc levels: Moderate disc degeneration throughout the mid and lower cervical spine. Moderate upper cervical facet arthrosis. Upper chest: Grossly clear lung apices. Other: Subcentimeter low-density right thyroid nodule. Mild calcified atherosclerosis at the carotid bifurcations. IMPRESSION: 1. Large volume acute subarachnoid hemorrhage with a 4.7 cm pericallosal hematoma. Head CTA is recommended to evaluate for a ruptured pericallosal or other distal ACA aneurysm. 2. Intraventricular extension with small volume blood products. No current evidence of hydrocephalus. 3. Small acute left frontal convexity subdural hematoma. 4. No acute fracture in the cervical spine. Critical Value/emergent results were called by telephone at the time of interpretation on 04/19/2017 at 1:49 pm to Dr. Crista Curb , who verbally acknowledged these results. Electronically Signed   By: Sebastian Ache M.D.   On: 04/19/2017 14:00   Ct Angio Neck W And/or Wo Contrast  Result Date: 04/19/2017 CLINICAL DATA:  Continued surveillance intracranial bleed. Fell during the night. Nonverbal. EXAM: CT ANGIOGRAPHY HEAD AND NECK TECHNIQUE: Multidetector CT imaging of the head and neck was performed using the standard protocol during bolus administration of intravenous contrast. Multiplanar CT image reconstructions and MIPs were obtained to evaluate the vascular anatomy. Carotid stenosis measurements (when applicable) are obtained utilizing NASCET criteria, using the distal  internal carotid diameter as the denominator. CONTRAST:  Isovue 370, 50 mL. COMPARISON:  CT head and C-spine earlier today. FINDINGS: CTA NECK Aortic arch: Standard branching. Imaged portion shows no evidence of aneurysm or dissection. No significant stenosis of the major arch vessel origins. Right carotid system: Minor atheromatous change. No evidence of dissection, stenosis (50% or greater) or occlusion. Left carotid system: Minor atheromatous change. No evidence of dissection, stenosis (50% or greater) or occlusion. Vertebral arteries: LEFT vertebral dominant. No evidence of dissection, stenosis (50% or greater) or occlusion. Nonvascular soft tissues: No lung apex lesion. Developing pulmonary edema. No neck mass. Cervical spondylosis. CTA HEAD Anterior circulation: Minor atheromatous change of the skull base and supraclinoid internal carotid arteries, predominantly calcific. No proximal stenosis of the anterior or middle cerebral arteries. The anterior cerebral arteries are visualized throughout their A2 and A3 segments up to and beyond the large midline intracranial hemorrhage. No aneurysm is seen. There is no evidence for significant vasculitis. No evidence for vascular malformation. Posterior circulation: LEFT vertebral dominant but both contribute to basilar formation. No significant stenosis. No saccular aneurysm. Venous sinuses: As permitted by contrast timing, patent. Anatomic variants: None of significance. Delayed phase:   No abnormal intracranial enhancement. Comparison with the previous MR from 03/25/2017 shows no underlying abnormality prior to the bleed. Compared with the earlier CT, the hemorrhage is no larger. IMPRESSION:  No arterial or venous abnormality is seen to suggest underlying cause for the observed parenchymal, subarachnoid, and intraventricular hemorrhage. Given the history of fall, the observed findings may be posttraumatic in origin. Minor extracranial and intracranial atherosclerotic  disease, without flow reducing lesion or dissection. Electronically Signed   By: Elsie Stain M.D.   On: 04/19/2017 15:21   Ct Cervical Spine Wo Contrast  Result Date: 04/19/2017 CLINICAL DATA:  Larey Seat during the night. Altered mental status this morning. Nonverbal. Initial encounter. EXAM: CT HEAD WITHOUT CONTRAST CT CERVICAL SPINE WITHOUT CONTRAST TECHNIQUE: Multidetector CT imaging of the head and cervical spine was performed following the standard protocol without intravenous contrast. Multiplanar CT image reconstructions of the cervical spine were also generated. COMPARISON:  Brain MRI 03/25/2017. Cervical spine radiographs 02/02/2008. FINDINGS: CT HEAD FINDINGS Brain: There is a large volume of acute subarachnoid hemorrhage. A discrete hematoma in the pericallosal region measures 3.7 x 4.7 x 1.8 cm. Subarachnoid hemorrhage is present in multiple sulci along the interhemispheric fissure bilaterally as well as in the predominantly right sylvian fissure. There is intraventricular hemorrhage in the left greater than right lateral ventricles, third ventricle, and fourth ventricle. There is no significant ventricular enlargement compared to the prior MRI to indicate acute hydrocephalus. A small acute subdural hematoma over left frontal convexity measures 6 mm in thickness. No acute large territory infarct is seen. There is no midline shift. Mild cerebral atrophy is noted. Cerebral white matter hypodensities are nonspecific but compatible with chronic small vessel ischemic disease, mild for age. Vascular: Calcified atherosclerosis at the skullbase. Skull: No fracture or focal osseous lesion. Sinuses/Orbits: Clear paranasal sinuses. Small chronic left mastoid effusion. Prior right mastoidectomy, canal wall up. Bilateral cataract extraction. Other: None. CT CERVICAL SPINE FINDINGS Alignment: Minimal anterolisthesis of C6 on C7 which appears degenerative. Straightening of the normal cervical lordosis. Skull base and  vertebrae: No acute fracture or destructive osseous process is identified. Soft tissues and spinal canal: No prevertebral fluid or swelling. Disc levels: Moderate disc degeneration throughout the mid and lower cervical spine. Moderate upper cervical facet arthrosis. Upper chest: Grossly clear lung apices. Other: Subcentimeter low-density right thyroid nodule. Mild calcified atherosclerosis at the carotid bifurcations. IMPRESSION: 1. Large volume acute subarachnoid hemorrhage with a 4.7 cm pericallosal hematoma. Head CTA is recommended to evaluate for a ruptured pericallosal or other distal ACA aneurysm. 2. Intraventricular extension with small volume blood products. No current evidence of hydrocephalus. 3. Small acute left frontal convexity subdural hematoma. 4. No acute fracture in the cervical spine. Critical Value/emergent results were called by telephone at the time of interpretation on 04/19/2017 at 1:49 pm to Dr. Crista Curb , who verbally acknowledged these results. Electronically Signed   By: Sebastian Ache M.D.   On: 04/19/2017 14:00    Procedures Procedures (including critical care time) CRITICAL CARE Performed by: Lavera Guise   Total critical care time: 40 minutes  Critical care time was exclusive of separately billable procedures and treating other patients.  Critical care was necessary to treat or prevent imminent or life-threatening deterioration.  Critical care was time spent personally by me on the following activities: development of treatment plan with patient and/or surrogate as well as nursing, discussions with consultants, evaluation of patient's response to treatment, examination of patient, obtaining history from patient or surrogate, ordering and performing treatments and interventions, ordering and review of laboratory studies, ordering and review of radiographic studies, pulse oximetry and re-evaluation of patient's condition.  Medications Ordered in ED Medications  iopamidol  (ISOVUE-370) 76 % injection (50 mLs  Contrast Given 04/19/17 1443)     Initial Impression / Assessment and Plan / ED Course  I have reviewed the triage vital signs and the nursing notes.  Pertinent labs & imaging results that were available during my care of the patient were reviewed by me and considered in my medical decision making (see chart for details).     Spoke with family members. She does have DNR paperwork, which they are working on finding. She is alert in ED, but not obeying commands and non-verbal. However, family members later state she cannot hear without hearing aids. Maybe limiting her exam.   Difficult to assess neuro exam, but will move bilateral upper extremities with stimuli. Up-going babinski noted bilaterally in the feet. CT obtained and visualized. With evidence of subarachnoid hemorrhage and interventricular hemorrhage. Discussed with radiology who felt that this could be aneurysmal in nature. CTA obtained after speaking with Dr. Venetia Maxon who is on-call for neurosurgery. CTA visualized without evidence of aneurysm. Thus, this is likely traumatic in nature. Spoke with Dr. Venetia Maxon again from neurosurgery. He will come and evaluate patient in the ED and admit to the ICU for ongoing management.  Final Clinical Impressions(s) / ED Diagnoses   Final diagnoses:  Subarachnoid hemorrhage following injury, no loss of consciousness, initial encounter [facility])    New Prescriptions New Prescriptions   No medications on file     Lavera Guise, MD 04/19/17 [phone]

## 2017-04-19 NOTE — ED Triage Notes (Signed)
Pt here from home with c/o aloc after a fall around 1 am , ems went out then and put pt back to bed , daughter found pt this morning around 11am altered , not talking , smells of ammonia

## 2017-04-19 NOTE — ED Notes (Addendum)
Pt's Son Harvie HeckRandy took pt's watch and 4 rings home with him, unable to removed two rings on pt's right hand. Pt's son also states she has a DNR.

## 2017-04-19 NOTE — ED Notes (Addendum)
Cleaned pt and changed linens. NT placed female incontinence  pure wick external catheter

## 2017-04-19 NOTE — Consult Note (Signed)
Name: Theresa Cross MRN: [id number] DOB: 03/24/1925    ADMISSION DATE:  04/19/2017 CONSULTATION DATE:  04/19/2017  REFERRING MD :  Dr. Venetia Maxon  CHIEF COMPLAINT:  SAH  HISTORY OF PRESENT ILLNESS:  83 year old female with PMH as below, who presented to [facility] ED 7/24 after falling out of bed at 1 AM. She presented around 11 AM with chief complaint of confusion and inability to speak. CT of the head demonstrated large volume subarachnoid hemorrhage with intraventricular extension. Also small L frontal SDH. CT angiogram found no clear vascular explanation for hemorrhage. She was admitted by neurosurgery with plans for conservative management. PCCM asked to assist with ICU care.   Of note she has developed dementia, which has progressed to the point she can no longer live alone. Family has made her a DNR  SIGNIFICANT EVENTS  7/24 admit  STUDIES:  CT head 7/24 >Large volume acute subarachnoid hemorrhage with a 4.7 cm pericallosal hematoma. Head CTA is recommended to evaluate for a ruptured pericallosal or other distal ACA aneurysm. 2. Intraventricular extension with small volume blood products. No current evidence of hydrocephalus. 3. Small acute left frontal convexity subdural hematoma. CTA head 7/24 > No arterial or venous abnormality is seen to suggest underlying cause for the observed parenchymal, subarachnoid, and intraventricular hemorrhage. Given the history of fall, the observed findings may be posttraumatic in origin.  Minor extracranial and intracranial atherosclerotic disease, without flow reducing lesion or dissection.   PAST MEDICAL HISTORY :   has a past medical history of Allergy; Arthritis; and Osteoporosis.  has a past surgical history that includes Breast surgery; Eye surgery; and Abdominal hysterectomy. Prior to Admission medications   Medication Sig Start Date End Date Taking? Authorizing Provider  ALPRAZolam Prudy Feeler) 0.25 MG tablet  02/27/17   [provider]    divalproex (DEPAKOTE) 250 MG DR tablet  02/16/17   [provider]  sertraline (ZOLOFT) 100 MG tablet Take 1 tablet (100 mg total) by mouth at bedtime. 04/08/17   Anson Fret, MD  Triamcinolone Acetonide (TRIAMCINOLONE 0.1 % CREAM : EUCERIN) CREA Apply 1 application topically 2 (two) times daily. Apply twice a day to affected area. Mix is 240 g of triamcinolone with 240 g of Eucerin 12/03/15   Collene Gobble, MD   Allergies  Allergen Reactions  . Amoxicillin Itching  . Codeine Itching  . Gabapentin Itching  . Morphine Itching    FAMILY HISTORY:  family history includes Cancer in her brother, brother, and mother; Heart disease in her mother. SOCIAL HISTORY:  reports that she has never smoked. She has never used smokeless tobacco. She reports that she does not drink alcohol or use drugs.  REVIEW OF SYSTEMS:   Unable as patient is aphasic  SUBJECTIVE:   VITAL SIGNS: Temp:  [98.3 F (36.8 C)-99.2 F (37.3 C)] 98.3 F (36.8 C) (07/24 2000) Pulse Rate:  [66-81] 75 (07/24 2015) Resp:  [17-24] 19 (07/24 2015) BP: (136-171)/(54-105) 136/54 (07/24 2015) SpO2:  [91 %-99 %] 92 % (07/24 2015) Weight:  [60.3 kg (132 lb 15 oz)] 60.3 kg (132 lb 15 oz) (07/24 1842)  PHYSICAL EXAMINATION: General:  Frail elderly female in NAD Neuro:  Arouses to verbal, does not follow commands. Appropriately withdrawal from noxious stimuli.  HEENT:  /AT, PERRL, No appreciable JVD Cardiovascular:  RRR, no MRG Lungs:  Clear Abdomen:  Soft, non-distended Musculoskeletal:  No acute deformity Skin:  Grossly intact   Recent Labs Lab 04/19/17 1245 04/19/17  1850  NA 129* 130*  K 4.0 3.8  CL 95* 95*  CO2 27 22  BUN 10 10  CREATININE 0.71 0.64  GLUCOSE 122* 136*    Recent Labs Lab 04/19/17 1245 04/19/17 1850  HGB 13.0 13.8  HCT 38.5 40.2  WBC 10.5 12.6*  PLT 269 270   Ct Angio Head W Or Wo Contrast  Result Date: 04/19/2017 CLINICAL DATA:  Continued surveillance intracranial  bleed. Fell during the night. Nonverbal. EXAM: CT ANGIOGRAPHY HEAD AND NECK TECHNIQUE: Multidetector CT imaging of the head and neck was performed using the standard protocol during bolus administration of intravenous contrast. Multiplanar CT image reconstructions and MIPs were obtained to evaluate the vascular anatomy. Carotid stenosis measurements (when applicable) are obtained utilizing NASCET criteria, using the distal internal carotid diameter as the denominator. CONTRAST:  Isovue 370, 50 mL. COMPARISON:  CT head and C-spine earlier today. FINDINGS: CTA NECK Aortic arch: Standard branching. Imaged portion shows no evidence of aneurysm or dissection. No significant stenosis of the major arch vessel origins. Right carotid system: Minor atheromatous change. No evidence of dissection, stenosis (50% or greater) or occlusion. Left carotid system: Minor atheromatous change. No evidence of dissection, stenosis (50% or greater) or occlusion. Vertebral arteries: LEFT vertebral dominant. No evidence of dissection, stenosis (50% or greater) or occlusion. Nonvascular soft tissues: No lung apex lesion. Developing pulmonary edema. No neck mass. Cervical spondylosis. CTA HEAD Anterior circulation: Minor atheromatous change of the skull base and supraclinoid internal carotid arteries, predominantly calcific. No proximal stenosis of the anterior or middle cerebral arteries. The anterior cerebral arteries are visualized throughout their A2 and A3 segments up to and beyond the large midline intracranial hemorrhage. No aneurysm is seen. There is no evidence for significant vasculitis. No evidence for vascular malformation. Posterior circulation: LEFT vertebral dominant but both contribute to basilar formation. No significant stenosis. No saccular aneurysm. Venous sinuses: As permitted by contrast timing, patent. Anatomic variants: None of significance. Delayed phase:   No abnormal intracranial enhancement. Comparison with the  previous MR from 03/25/2017 shows no underlying abnormality prior to the bleed. Compared with the earlier CT, the hemorrhage is no larger. IMPRESSION: No arterial or venous abnormality is seen to suggest underlying cause for the observed parenchymal, subarachnoid, and intraventricular hemorrhage. Given the history of fall, the observed findings may be posttraumatic in origin. Minor extracranial and intracranial atherosclerotic disease, without flow reducing lesion or dissection. Electronically Signed   By: Elsie Stain M.D.   On: 04/19/2017 15:21   Ct Head Wo Contrast  Result Date: 04/19/2017 CLINICAL DATA:  Larey Seat during the night. Altered mental status this morning. Nonverbal. Initial encounter. EXAM: CT HEAD WITHOUT CONTRAST CT CERVICAL SPINE WITHOUT CONTRAST TECHNIQUE: Multidetector CT imaging of the head and cervical spine was performed following the standard protocol without intravenous contrast. Multiplanar CT image reconstructions of the cervical spine were also generated. COMPARISON:  Brain MRI 03/25/2017. Cervical spine radiographs 02/02/2008. FINDINGS: CT HEAD FINDINGS Brain: There is a large volume of acute subarachnoid hemorrhage. A discrete hematoma in the pericallosal region measures 3.7 x 4.7 x 1.8 cm. Subarachnoid hemorrhage is present in multiple sulci along the interhemispheric fissure bilaterally as well as in the predominantly right sylvian fissure. There is intraventricular hemorrhage in the left greater than right lateral ventricles, third ventricle, and fourth ventricle. There is no significant ventricular enlargement compared to the prior MRI to indicate acute hydrocephalus. A small acute subdural hematoma over left frontal convexity measures 6 mm in thickness. No acute  large territory infarct is seen. There is no midline shift. Mild cerebral atrophy is noted. Cerebral white matter hypodensities are nonspecific but compatible with chronic small vessel ischemic disease, mild for age.  Vascular: Calcified atherosclerosis at the skullbase. Skull: No fracture or focal osseous lesion. Sinuses/Orbits: Clear paranasal sinuses. Small chronic left mastoid effusion. Prior right mastoidectomy, canal wall up. Bilateral cataract extraction. Other: None. CT CERVICAL SPINE FINDINGS Alignment: Minimal anterolisthesis of C6 on C7 which appears degenerative. Straightening of the normal cervical lordosis. Skull base and vertebrae: No acute fracture or destructive osseous process is identified. Soft tissues and spinal canal: No prevertebral fluid or swelling. Disc levels: Moderate disc degeneration throughout the mid and lower cervical spine. Moderate upper cervical facet arthrosis. Upper chest: Grossly clear lung apices. Other: Subcentimeter low-density right thyroid nodule. Mild calcified atherosclerosis at the carotid bifurcations. IMPRESSION: 1. Large volume acute subarachnoid hemorrhage with a 4.7 cm pericallosal hematoma. Head CTA is recommended to evaluate for a ruptured pericallosal or other distal ACA aneurysm. 2. Intraventricular extension with small volume blood products. No current evidence of hydrocephalus. 3. Small acute left frontal convexity subdural hematoma. 4. No acute fracture in the cervical spine. Critical Value/emergent results were called by telephone at the time of interpretation on 04/19/2017 at 1:49 pm to Dr. Crista CurbANA LIU , who verbally acknowledged these results. Electronically Signed   By: Sebastian AcheAllen  Grady M.D.   On: 04/19/2017 14:00   Ct Angio Neck W And/or Wo Contrast  Result Date: 04/19/2017 CLINICAL DATA:  Continued surveillance intracranial bleed. Fell during the night. Nonverbal. EXAM: CT ANGIOGRAPHY HEAD AND NECK TECHNIQUE: Multidetector CT imaging of the head and neck was performed using the standard protocol during bolus administration of intravenous contrast. Multiplanar CT image reconstructions and MIPs were obtained to evaluate the vascular anatomy. Carotid stenosis measurements  (when applicable) are obtained utilizing NASCET criteria, using the distal internal carotid diameter as the denominator. CONTRAST:  Isovue 370, 50 mL. COMPARISON:  CT head and C-spine earlier today. FINDINGS: CTA NECK Aortic arch: Standard branching. Imaged portion shows no evidence of aneurysm or dissection. No significant stenosis of the major arch vessel origins. Right carotid system: Minor atheromatous change. No evidence of dissection, stenosis (50% or greater) or occlusion. Left carotid system: Minor atheromatous change. No evidence of dissection, stenosis (50% or greater) or occlusion. Vertebral arteries: LEFT vertebral dominant. No evidence of dissection, stenosis (50% or greater) or occlusion. Nonvascular soft tissues: No lung apex lesion. Developing pulmonary edema. No neck mass. Cervical spondylosis. CTA HEAD Anterior circulation: Minor atheromatous change of the skull base and supraclinoid internal carotid arteries, predominantly calcific. No proximal stenosis of the anterior or middle cerebral arteries. The anterior cerebral arteries are visualized throughout their A2 and A3 segments up to and beyond the large midline intracranial hemorrhage. No aneurysm is seen. There is no evidence for significant vasculitis. No evidence for vascular malformation. Posterior circulation: LEFT vertebral dominant but both contribute to basilar formation. No significant stenosis. No saccular aneurysm. Venous sinuses: As permitted by contrast timing, patent. Anatomic variants: None of significance. Delayed phase:   No abnormal intracranial enhancement. Comparison with the previous MR from 03/25/2017 shows no underlying abnormality prior to the bleed. Compared with the earlier CT, the hemorrhage is no larger. IMPRESSION: No arterial or venous abnormality is seen to suggest underlying cause for the observed parenchymal, subarachnoid, and intraventricular hemorrhage. Given the history of fall, the observed findings may be  posttraumatic in origin. Minor extracranial and intracranial atherosclerotic disease, without flow reducing  lesion or dissection. Electronically Signed   By: Elsie Stain M.D.   On: 04/19/2017 15:21   Ct Cervical Spine Wo Contrast  Result Date: 04/19/2017 CLINICAL DATA:  Larey Seat during the night. Altered mental status this morning. Nonverbal. Initial encounter. EXAM: CT HEAD WITHOUT CONTRAST CT CERVICAL SPINE WITHOUT CONTRAST TECHNIQUE: Multidetector CT imaging of the head and cervical spine was performed following the standard protocol without intravenous contrast. Multiplanar CT image reconstructions of the cervical spine were also generated. COMPARISON:  Brain MRI 03/25/2017. Cervical spine radiographs 02/02/2008. FINDINGS: CT HEAD FINDINGS Brain: There is a large volume of acute subarachnoid hemorrhage. A discrete hematoma in the pericallosal region measures 3.7 x 4.7 x 1.8 cm. Subarachnoid hemorrhage is present in multiple sulci along the interhemispheric fissure bilaterally as well as in the predominantly right sylvian fissure. There is intraventricular hemorrhage in the left greater than right lateral ventricles, third ventricle, and fourth ventricle. There is no significant ventricular enlargement compared to the prior MRI to indicate acute hydrocephalus. A small acute subdural hematoma over left frontal convexity measures 6 mm in thickness. No acute large territory infarct is seen. There is no midline shift. Mild cerebral atrophy is noted. Cerebral white matter hypodensities are nonspecific but compatible with chronic small vessel ischemic disease, mild for age. Vascular: Calcified atherosclerosis at the skullbase. Skull: No fracture or focal osseous lesion. Sinuses/Orbits: Clear paranasal sinuses. Small chronic left mastoid effusion. Prior right mastoidectomy, canal wall up. Bilateral cataract extraction. Other: None. CT CERVICAL SPINE FINDINGS Alignment: Minimal anterolisthesis of C6 on C7 which appears  degenerative. Straightening of the normal cervical lordosis. Skull base and vertebrae: No acute fracture or destructive osseous process is identified. Soft tissues and spinal canal: No prevertebral fluid or swelling. Disc levels: Moderate disc degeneration throughout the mid and lower cervical spine. Moderate upper cervical facet arthrosis. Upper chest: Grossly clear lung apices. Other: Subcentimeter low-density right thyroid nodule. Mild calcified atherosclerosis at the carotid bifurcations. IMPRESSION: 1. Large volume acute subarachnoid hemorrhage with a 4.7 cm pericallosal hematoma. Head CTA is recommended to evaluate for a ruptured pericallosal or other distal ACA aneurysm. 2. Intraventricular extension with small volume blood products. No current evidence of hydrocephalus. 3. Small acute left frontal convexity subdural hematoma. 4. No acute fracture in the cervical spine. Critical Value/emergent results were called by telephone at the time of interpretation on 04/19/2017 at 1:49 pm to Dr. Crista Curb , who verbally acknowledged these results. Electronically Signed   By: Sebastian Ache M.D.   On: 04/19/2017 14:00   Dg Chest Port 1 View  Result Date: 04/19/2017 CLINICAL DATA:  Recent fall with altered mental status, initial encounter EXAM: PORTABLE CHEST 1 VIEW COMPARISON:  None. FINDINGS: Cardiac shadow is mildly prominent but accentuated by the portable technique. Aortic calcifications are seen. The overall inspiratory effort is poor without focal infiltrate. Degenerative changes of the thoracolumbar spine are seen. IMPRESSION: No active disease. Electronically Signed   By: Alcide Clever M.D.   On: 04/19/2017 18:32    ASSESSMENT / PLAN:  Traumatic subarachnoid hemorrhage with intraventricular extension Left frontal SDH, small.  -Management per neurosurgery/neurology -Frequent neuro checks -Keppra -If no improvement family would lean towards palliation.   Hypertension: on admission, no history of  this -SBP goal <143mmHg -Clevidipine infusion as needed.   Leukocytosis: likely stress reponse -Monitor off ABX  Dementia -Depakote level, holding med while not taking PO -Holding alprazolam, sertraline.  Nutrition: -NPO -Maintenance fluides NS @ 50mL /Hr  Joneen Roach, AGACNP-BC [facility] Pager [phone] or [phone]  04/19/2017 9:20 PM

## 2017-04-19 NOTE — ED Notes (Signed)
Pt dtr at bedside. Dr. Joni FearsLui made aware

## 2017-04-19 NOTE — H&P (Signed)
Reason for Consult:ICH/SAH/IVH Referring Physician: Anjoli Diemer Theresa Cross is an 83 y.o. female.  HPI: 83 year old female brought in by EMS for altered mental status. History of arthritis and osteoporosis. No blood thinners. Fall out of bed this morning at 1 AM. EMS called and felt Theresa Cross was well. This morning at 11 AM family noted Theresa Cross was confused, non-verbal. At baseline very functional. Theresa Cross has been progressively developing dementia and can no longer live alone.  Theresa Cross has been made a DNR by her family.   Past Medical History:  Diagnosis Date  . Allergy   . Arthritis   . Osteoporosis     Past Surgical History:  Procedure Laterality Date  . ABDOMINAL HYSTERECTOMY    . BREAST SURGERY    . EYE SURGERY      Family History  Problem Relation Age of Onset  . Cancer Mother   . Heart disease Mother   . Cancer Brother   . Cancer Brother     Social History:  reports that Theresa Cross has never smoked. Theresa Cross has never used smokeless tobacco. Theresa Cross reports that Theresa Cross does not drink alcohol or use drugs.  Allergies:  Allergies  Allergen Reactions  . Amoxicillin Itching  . Codeine Itching  . Gabapentin Itching  . Morphine Itching    Medications: I have reviewed the Theresa Cross's current medications.  Results for orders placed or performed during the hospital encounter of 04/19/17 (from the past 48 hour(s))  CBC with Differential     Status: Abnormal   Collection Time: 04/19/17 12:45 PM  Result Value Ref Range   WBC 10.5 4.0 - 10.5 K/uL   RBC 4.56 3.87 - 5.11 MIL/uL   Hemoglobin 13.0 12.0 - 15.0 g/dL   HCT 38.5 36.0 - 46.0 %   MCV 84.4 78.0 - 100.0 fL   MCH 28.5 26.0 - 34.0 pg   MCHC 33.8 30.0 - 36.0 g/dL   RDW 13.2 11.5 - 15.5 %   Platelets 269 150 - 400 K/uL   Neutrophils Relative % 77 %   Neutro Abs 8.2 (H) 1.7 - 7.7 K/uL   Lymphocytes Relative 16 %   Lymphs Abs 1.7 0.7 - 4.0 K/uL   Monocytes Relative 6 %   Monocytes Absolute 0.6 0.1 - 1.0 K/uL   Eosinophils Relative 1 %   Eosinophils  Absolute 0.1 0.0 - 0.7 K/uL   Basophils Relative 0 %   Basophils Absolute 0.0 0.0 - 0.1 K/uL  Comprehensive metabolic panel     Status: Abnormal   Collection Time: 04/19/17 12:45 PM  Result Value Ref Range   Sodium 129 (L) 135 - 145 mmol/L   Potassium 4.0 3.5 - 5.1 mmol/L   Chloride 95 (L) 101 - 111 mmol/L   CO2 27 22 - 32 mmol/L   Glucose, Bld 122 (H) 65 - 99 mg/dL   BUN 10 6 - 20 mg/dL   Creatinine, Ser 0.71 0.44 - 1.00 mg/dL   Calcium 9.0 8.9 - 10.3 mg/dL   Total Protein 6.6 6.5 - 8.1 g/dL   Albumin 3.8 3.5 - 5.0 g/dL   AST 35 15 - 41 U/L   ALT 19 14 - 54 U/L   Alkaline Phosphatase 40 38 - 126 U/L   Total Bilirubin 0.5 0.3 - 1.2 mg/dL   GFR calc non Af Amer >60 >60 mL/min   GFR calc Af Amer >60 >60 mL/min    Comment: (NOTE) The eGFR has been calculated using the CKD EPI equation. This  calculation has not been validated in all clinical situations. eGFR's persistently <60 mL/min signify possible Chronic Kidney Disease.    Anion gap 7 5 - 15  Urinalysis, Routine w reflex microscopic     Status: Abnormal   Collection Time: 04/19/17  1:00 PM  Result Value Ref Range   Color, Urine YELLOW YELLOW   APPearance CLOUDY (A) CLEAR   Specific Gravity, Urine 1.009 1.005 - 1.030   pH 8.0 5.0 - 8.0   Glucose, UA NEGATIVE NEGATIVE mg/dL   Hgb urine dipstick NEGATIVE NEGATIVE   Bilirubin Urine NEGATIVE NEGATIVE   Ketones, ur NEGATIVE NEGATIVE mg/dL   Protein, ur NEGATIVE NEGATIVE mg/dL   Nitrite NEGATIVE NEGATIVE   Leukocytes, UA NEGATIVE NEGATIVE  I-Stat CG4 Lactic Acid, ED     Status: None   Collection Time: 04/19/17  1:04 PM  Result Value Ref Range   Lactic Acid, Venous 0.73 0.5 - 1.9 mmol/L  Protime-INR     Status: None   Collection Time: 04/19/17  1:45 PM  Result Value Ref Range   Prothrombin Time 13.1 11.4 - 15.2 seconds   INR 0.99   APTT     Status: None   Collection Time: 04/19/17  1:45 PM  Result Value Ref Range   aPTT 27 24 - 36 seconds  I-Stat CG4 Lactic Acid, ED      Status: None   Collection Time: 04/19/17  4:25 PM  Result Value Ref Range   Lactic Acid, Venous 0.63 0.5 - 1.9 mmol/L    Ct Angio Head W Or Wo Contrast  Result Date: 04/19/2017 CLINICAL DATA:  Continued surveillance intracranial bleed. Fell during the night. Nonverbal. EXAM: CT ANGIOGRAPHY HEAD AND NECK TECHNIQUE: Multidetector CT imaging of the head and neck was performed using the standard protocol during bolus administration of intravenous contrast. Multiplanar CT image reconstructions and MIPs were obtained to evaluate the vascular anatomy. Carotid stenosis measurements (when applicable) are obtained utilizing NASCET criteria, using the distal internal carotid diameter as the denominator. CONTRAST:  Isovue 370, 50 mL. COMPARISON:  CT head and C-spine earlier today. FINDINGS: CTA NECK Aortic arch: Standard branching. Imaged portion shows no evidence of aneurysm or dissection. No significant stenosis of the major arch vessel origins. Right carotid system: Minor atheromatous change. No evidence of dissection, stenosis (50% or greater) or occlusion. Left carotid system: Minor atheromatous change. No evidence of dissection, stenosis (50% or greater) or occlusion. Vertebral arteries: LEFT vertebral dominant. No evidence of dissection, stenosis (50% or greater) or occlusion. Nonvascular soft tissues: No lung apex lesion. Developing pulmonary edema. No neck mass. Cervical spondylosis. CTA HEAD Anterior circulation: Minor atheromatous change of the skull base and supraclinoid internal carotid arteries, predominantly calcific. No proximal stenosis of the anterior or middle cerebral arteries. The anterior cerebral arteries are visualized throughout their A2 and A3 segments up to and beyond the large midline intracranial hemorrhage. No aneurysm is seen. There is no evidence for significant vasculitis. No evidence for vascular malformation. Posterior circulation: LEFT vertebral dominant but both contribute to  basilar formation. No significant stenosis. No saccular aneurysm. Venous sinuses: As permitted by contrast timing, patent. Anatomic variants: None of significance. Delayed phase:   No abnormal intracranial enhancement. Comparison with the previous MR from 03/25/2017 shows no underlying abnormality prior to the bleed. Compared with the earlier CT, the hemorrhage is no larger. IMPRESSION: No arterial or venous abnormality is seen to suggest underlying cause for the observed parenchymal, subarachnoid, and intraventricular hemorrhage. Given the history of fall,  the observed findings may be posttraumatic in origin. Minor extracranial and intracranial atherosclerotic disease, without flow reducing lesion or dissection. Electronically Signed   By: Staci Righter M.Cross.   On: 04/19/2017 15:21   Ct Head Wo Contrast  Result Date: 04/19/2017 CLINICAL DATA:  [place] during the night. Altered mental status this morning. Nonverbal. Initial encounter. EXAM: CT HEAD WITHOUT CONTRAST CT CERVICAL SPINE WITHOUT CONTRAST TECHNIQUE: Multidetector CT imaging of the head and cervical spine was performed following the standard protocol without intravenous contrast. Multiplanar CT image reconstructions of the cervical spine were also generated. COMPARISON:  Brain MRI 03/25/2017. Cervical spine radiographs 02/02/2008. FINDINGS: CT HEAD FINDINGS Brain: There is a large volume of acute subarachnoid hemorrhage. A discrete hematoma in the pericallosal region measures 3.7 x 4.7 x 1.8 cm. Subarachnoid hemorrhage is present in multiple sulci along the interhemispheric fissure bilaterally as well as in the predominantly right sylvian fissure. There is intraventricular hemorrhage in the left greater than right lateral ventricles, third ventricle, and fourth ventricle. There is no significant ventricular enlargement compared to the prior MRI to indicate acute hydrocephalus. A small acute subdural hematoma over left frontal convexity measures 6 mm in  thickness. No acute large territory infarct is seen. There is no midline shift. Mild cerebral atrophy is noted. Cerebral white matter hypodensities are nonspecific but compatible with chronic small vessel ischemic disease, mild for age. Vascular: Calcified atherosclerosis at the skullbase. Skull: No fracture or focal osseous lesion. Sinuses/Orbits: Clear paranasal sinuses. Small chronic left mastoid effusion. Prior right mastoidectomy, canal wall up. Bilateral cataract extraction. Other: None. CT CERVICAL SPINE FINDINGS Alignment: Minimal anterolisthesis of C6 on C7 which appears degenerative. Straightening of the normal cervical lordosis. Skull base and vertebrae: No acute fracture or destructive osseous process is identified. Soft tissues and spinal canal: No prevertebral fluid or swelling. Disc levels: Moderate disc degeneration throughout the mid and lower cervical spine. Moderate upper cervical facet arthrosis. Upper chest: Grossly clear lung apices. Other: Subcentimeter low-density right thyroid nodule. Mild calcified atherosclerosis at the carotid bifurcations. IMPRESSION: 1. Large volume acute subarachnoid hemorrhage with a 4.7 cm pericallosal hematoma. Head CTA is recommended to evaluate for a ruptured pericallosal or other distal ACA aneurysm. 2. Intraventricular extension with small volume blood products. No current evidence of hydrocephalus. 3. Small acute left frontal convexity subdural hematoma. 4. No acute fracture in the cervical spine. Critical Value/emergent results were called by telephone at the time of interpretation on 04/19/2017 at 1:49 pm to Dr. Brantley Stage , who verbally acknowledged these results. Electronically Signed   By: Logan Bores M.Cross.   On: 04/19/2017 14:00   Ct Angio Neck W And/or Wo Contrast  Result Date: 04/19/2017 CLINICAL DATA:  Continued surveillance intracranial bleed. Fell during the night. Nonverbal. EXAM: CT ANGIOGRAPHY HEAD AND NECK TECHNIQUE: Multidetector CT imaging of  the head and neck was performed using the standard protocol during bolus administration of intravenous contrast. Multiplanar CT image reconstructions and MIPs were obtained to evaluate the vascular anatomy. Carotid stenosis measurements (when applicable) are obtained utilizing NASCET criteria, using the distal internal carotid diameter as the denominator. CONTRAST:  Isovue 370, 50 mL. COMPARISON:  CT head and C-spine earlier today. FINDINGS: CTA NECK Aortic arch: Standard branching. Imaged portion shows no evidence of aneurysm or dissection. No significant stenosis of the major arch vessel origins. Right carotid system: Minor atheromatous change. No evidence of dissection, stenosis (50% or greater) or occlusion. Left carotid system: Minor atheromatous change. No evidence of dissection, stenosis (50%  or greater) or occlusion. Vertebral arteries: LEFT vertebral dominant. No evidence of dissection, stenosis (50% or greater) or occlusion. Nonvascular soft tissues: No lung apex lesion. Developing pulmonary edema. No neck mass. Cervical spondylosis. CTA HEAD Anterior circulation: Minor atheromatous change of the skull base and supraclinoid internal carotid arteries, predominantly calcific. No proximal stenosis of the anterior or middle cerebral arteries. The anterior cerebral arteries are visualized throughout their A2 and A3 segments up to and beyond the large midline intracranial hemorrhage. No aneurysm is seen. There is no evidence for significant vasculitis. No evidence for vascular malformation. Posterior circulation: LEFT vertebral dominant but both contribute to basilar formation. No significant stenosis. No saccular aneurysm. Venous sinuses: As permitted by contrast timing, patent. Anatomic variants: None of significance. Delayed phase:   No abnormal intracranial enhancement. Comparison with the previous MR from 03/25/2017 shows no underlying abnormality prior to the bleed. Compared with the earlier CT, the  hemorrhage is no larger. IMPRESSION: No arterial or venous abnormality is seen to suggest underlying cause for the observed parenchymal, subarachnoid, and intraventricular hemorrhage. Given the history of fall, the observed findings may be posttraumatic in origin. Minor extracranial and intracranial atherosclerotic disease, without flow reducing lesion or dissection. Electronically Signed   By: Staci Righter M.Cross.   On: 04/19/2017 15:21   Ct Cervical Spine Wo Contrast  Result Date: 04/19/2017 CLINICAL DATA:  [place] during the night. Altered mental status this morning. Nonverbal. Initial encounter. EXAM: CT HEAD WITHOUT CONTRAST CT CERVICAL SPINE WITHOUT CONTRAST TECHNIQUE: Multidetector CT imaging of the head and cervical spine was performed following the standard protocol without intravenous contrast. Multiplanar CT image reconstructions of the cervical spine were also generated. COMPARISON:  Brain MRI 03/25/2017. Cervical spine radiographs 02/02/2008. FINDINGS: CT HEAD FINDINGS Brain: There is a large volume of acute subarachnoid hemorrhage. A discrete hematoma in the pericallosal region measures 3.7 x 4.7 x 1.8 cm. Subarachnoid hemorrhage is present in multiple sulci along the interhemispheric fissure bilaterally as well as in the predominantly right sylvian fissure. There is intraventricular hemorrhage in the left greater than right lateral ventricles, third ventricle, and fourth ventricle. There is no significant ventricular enlargement compared to the prior MRI to indicate acute hydrocephalus. A small acute subdural hematoma over left frontal convexity measures 6 mm in thickness. No acute large territory infarct is seen. There is no midline shift. Mild cerebral atrophy is noted. Cerebral white matter hypodensities are nonspecific but compatible with chronic small vessel ischemic disease, mild for age. Vascular: Calcified atherosclerosis at the skullbase. Skull: No fracture or focal osseous lesion.  Sinuses/Orbits: Clear paranasal sinuses. Small chronic left mastoid effusion. Prior right mastoidectomy, canal wall up. Bilateral cataract extraction. Other: None. CT CERVICAL SPINE FINDINGS Alignment: Minimal anterolisthesis of C6 on C7 which appears degenerative. Straightening of the normal cervical lordosis. Skull base and vertebrae: No acute fracture or destructive osseous process is identified. Soft tissues and spinal canal: No prevertebral fluid or swelling. Disc levels: Moderate disc degeneration throughout the mid and lower cervical spine. Moderate upper cervical facet arthrosis. Upper chest: Grossly clear lung apices. Other: Subcentimeter low-density right thyroid nodule. Mild calcified atherosclerosis at the carotid bifurcations. IMPRESSION: 1. Large volume acute subarachnoid hemorrhage with a 4.7 cm pericallosal hematoma. Head CTA is recommended to evaluate for a ruptured pericallosal or other distal ACA aneurysm. 2. Intraventricular extension with small volume blood products. No current evidence of hydrocephalus. 3. Small acute left frontal convexity subdural hematoma. 4. No acute fracture in the cervical spine. Critical Value/emergent results were  called by telephone at the time of interpretation on 04/19/2017 at 1:49 pm to Dr. Brantley Stage , who verbally acknowledged these results. Electronically Signed   By: Logan Bores M.Cross.   On: 04/19/2017 14:00    Review of Systems - Negative except Dementia, Arthritis, Osteoporosis, HOH    Blood pressure (!) 152/71, pulse 69, temperature 99.2 F (37.3 C), temperature source Rectal, resp. rate 17, SpO2 98 %. Physical Exam  Constitutional: Theresa Cross appears well-developed and well-nourished.  HENT:  Head: Normocephalic and atraumatic.  Eyes: Pupils are equal, round, and reactive to light.  Not following commands, but will track to voice  Neck: Normal range of motion.  Cardiovascular: Normal rate and regular rhythm.   Respiratory: Effort normal and breath  sounds normal.  GI: Soft. Bowel sounds are normal.  Musculoskeletal: Normal range of motion.  Neurological: GCS eye subscore is 4. GCS verbal subscore is 1. GCS motor subscore is 5.  Somnolent, arouses to stimulation, not following commands, but unclear if hears.  Moving all extremities spontaneously and to noxious stimuli.  Skin: Skin is warm and dry.    Assessment/Plan: Theresa Cross has non-aneurysmal SAH, ICH, IVH likely secondary to falling out of bed last night.  Theresa Cross has dementia and is DNR.  Will observe in ICU tonight, supportive care, no intubation or CPR.  Family understands situation and that we will agree to consider supportive care only if Theresa Cross does not improve.  Will ask CCM to help with Theresa Cross.  Repeat head CT in AM.  Theresa Verne D, MD 04/19/2017, 5:29 PM

## 2017-04-20 ENCOUNTER — Inpatient Hospital Stay (HOSPITAL_COMMUNITY): Payer: Medicare Other

## 2017-04-20 DIAGNOSIS — R4182 Altered mental status, unspecified: Secondary | ICD-10-CM

## 2017-04-20 DIAGNOSIS — E876 Hypokalemia: Secondary | ICD-10-CM

## 2017-04-20 DIAGNOSIS — G9340 Encephalopathy, unspecified: Secondary | ICD-10-CM

## 2017-04-20 DIAGNOSIS — I10 Essential (primary) hypertension: Secondary | ICD-10-CM

## 2017-04-20 DIAGNOSIS — R1310 Dysphagia, unspecified: Secondary | ICD-10-CM

## 2017-04-20 DIAGNOSIS — G3183 Dementia with Lewy bodies: Secondary | ICD-10-CM

## 2017-04-20 DIAGNOSIS — S066X0D Traumatic subarachnoid hemorrhage without loss of consciousness, subsequent encounter: Secondary | ICD-10-CM

## 2017-04-20 DIAGNOSIS — F0280 Dementia in other diseases classified elsewhere without behavioral disturbance: Secondary | ICD-10-CM

## 2017-04-20 LAB — GLUCOSE, CAPILLARY
GLUCOSE-CAPILLARY: 124 mg/dL — AB (ref 65–99)
GLUCOSE-CAPILLARY: 144 mg/dL — AB (ref 65–99)
Glucose-Capillary: 156 mg/dL — ABNORMAL HIGH (ref 65–99)
Glucose-Capillary: 150 mg/dL — ABNORMAL HIGH (ref 65–99)

## 2017-04-20 LAB — VALPROIC ACID LEVEL

## 2017-04-20 LAB — BASIC METABOLIC PANEL
Anion gap: 10 (ref 5–15)
BUN: 12 mg/dL (ref 6–20)
CHLORIDE: 97 mmol/L — AB (ref 101–111)
CO2: 26 mmol/L (ref 22–32)
CREATININE: 0.65 mg/dL (ref 0.44–1.00)
Calcium: 9.2 mg/dL (ref 8.9–10.3)
GFR calc Af Amer: >60 mL/min (ref >60)
GFR calc non Af Amer: >60 mL/min (ref >60)
GLUCOSE: 146 mg/dL — AB (ref 65–99)
POTASSIUM: 3.5 mmol/L (ref 3.5–5.1)
SODIUM: 133 mmol/L — AB (ref 135–145)

## 2017-04-20 LAB — CBC
HEMATOCRIT: 38.8 % (ref 36.0–46.0)
Hemoglobin: 13 g/dL (ref 12.0–15.0)
MCH: 28.2 pg (ref 26.0–34.0)
MCHC: 33.5 g/dL (ref 30.0–36.0)
MCV: 84.2 fL (ref 78.0–100.0)
Platelets: 296 10*3/uL (ref 150–400)
RBC: 4.61 MIL/uL (ref 3.87–5.11)
RDW: 13.4 % (ref 11.5–15.5)
WBC: 14.1 10*3/uL — AB (ref 4.0–10.5)

## 2017-04-20 LAB — PROTIME-INR
INR: 1.1
Prothrombin Time: 14.3 seconds (ref 11.4–15.2)

## 2017-04-20 MED ORDER — POTASSIUM CHLORIDE 10 MEQ/100ML IV SOLN
10.0000 meq | INTRAVENOUS | Status: AC
  Administered 2017-04-20 (×4): 10 meq via INTRAVENOUS
  Filled 2017-04-20 (×5): qty 100

## 2017-04-20 MED ORDER — PRO-STAT SUGAR FREE PO LIQD
30.0000 mL | Freq: Every day | ORAL | Status: DC
  Administered 2017-04-20: 30 mL via ORAL
  Filled 2017-04-20: qty 30

## 2017-04-20 MED ORDER — JEVITY 1.2 CAL PO LIQD
1000.0000 mL | ORAL | Status: DC
  Filled 2017-04-20 (×4): qty 1000

## 2017-04-20 MED ORDER — CHLORHEXIDINE GLUCONATE 0.12 % MT SOLN: 15.0000 mL | Freq: Two times a day (BID) | OROMUCOSAL | Status: DC

## 2017-04-20 MED ORDER — FREE WATER: 200.0000 mL | Freq: Four times a day (QID) | Status: DC

## 2017-04-20 MED ORDER — AMLODIPINE 1 MG/ML ORAL SUSPENSION: 5.0000 mg | Freq: Every day | ORAL | Status: DC

## 2017-04-20 MED ORDER — AMLODIPINE BESYLATE 5 MG PO TABS
5.0000 mg | ORAL_TABLET | Freq: Every day | ORAL | Status: DC
  Administered 2017-04-20: 5 mg via ORAL
  Filled 2017-04-20 (×2): qty 1

## 2017-04-20 MED ORDER — LABETALOL HCL 5 MG/ML IV SOLN
10.0000 mg | INTRAVENOUS | Status: DC | PRN
  Filled 2017-04-20: qty 4

## 2017-04-20 NOTE — Progress Notes (Signed)
OT Cancellation Note  Patient Details Name: Theresa Cross MRN: [id number] DOB: 04/03/1925   Cancelled Treatment:    Reason Eval/Treat Not Completed: Medical issues which prohibited therapy;Patient not medically ready. Pt with active bedrest orders. Will await increase in activity orders prior to initiating OT evaluation. Thank you for this referral!  Doristine Sectionharity A Nalina Yeatman, MS OTR/L  Pager: [phone]   Husein Guedes A Emali Heyward 04/20/2017, 7:22 AM

## 2017-04-20 NOTE — Progress Notes (Addendum)
PT Cancellation Note  Patient Details Name: Theresa Cross MRN: [id number] DOB: 03/06/1925   Cancelled Treatment:    Reason Eval/Treat Not Completed: Patient not medically ready. Pt currently on bedrest, will await increased activity order before initiating PT eval.   [name] 04/20/2017, 7:51 AM   Conni SlipperLaura Tahlor Berenguer, PT, DPT Acute Rehabilitation Services Pager: [phone]

## 2017-04-20 NOTE — Progress Notes (Signed)
Initial Nutrition Assessment  DOCUMENTATION CODES:  Not applicable  INTERVENTION:  Initiate TF via small bore NGT with Jevity 1.2 at goal rate of 45 ml/h (1080 ml per day) and Prostat 30 ml q24 hrs to provide 1396 kcals (+288 from cleviprex), 75 gm protein, 872 ml free water daily.  If IVF to be discontinued, flush with 200 mls free water q 6 hrs to provide total of 1672 mls water/day  NUTRITION DIAGNOSIS:  Inadequate oral intake related to lethargy/confusion (Nonresponsive) as evidenced by NPO status.  GOAL:  Patient will meet greater than or equal to 90% of their needs  MONITOR:  Diet advancement, Labs, Weight trends, I & O's, TF tolerance, Goals of care  REASON FOR ASSESSMENT:  Consult Enteral/tube feeding initiation and management  ASSESSMENT:  83 y/o female with minimal PMHx who was brought in by family due after she fell and subsequently became more confused and nonverbal. Work up revealed SAH, ICH, IVH. RD consulted for small bore NGT placement while observe for any improvement in status.   Patient non responsive. Family member at bedside reports that the patient was at baseline a cuople days ago. No recent changes in appetite. He notes she drank Ensure at home. Estimated her UBW as 125-135 lbs. Per chart, she has lost ~6 lbs in the last month, however this is not a clinically significant amount  Cortrak placed for short term nutrition. Tip at pylorus  Cleviprex @ 6 ml/hr= 288 kcals/day  NFPE: WDL, abdomen soft, non distended  Labs: BG 120-150 mg/dl, NWG:95.6WBC:14.1, Na: 213133,  Meds: PPI, Senna, Cleviprex    Recent Labs Lab 04/19/17 1245 04/19/17 1850 04/20/17 0327  NA 129* 130* 133*  K 4.0 3.8 3.5  CL 95* 95* 97*  CO2 27 22 26   BUN 10 10 12   CREATININE 0.71 0.64 0.65  CALCIUM 9.0 9.0 9.2  GLUCOSE 122* 136* 146*   Diet Order:  Diet NPO time specified  Skin:  Reviewed, no issues  Last BM:  Unknown  Height:  Ht Readings from Last 1 Encounters:  04/19/17 5\' 3"   (1.6 m)   Weight:  Wt Readings from Last 1 Encounters:  04/19/17 132 lb 15 oz (60.3 kg)   Wt Readings from Last 10 Encounters:  04/19/17 132 lb 15 oz (60.3 kg)  03/04/17 138 lb 9.6 oz (62.9 kg)  01/26/16 146 lb (66.2 kg)  12/03/15 141 lb (64 kg)  09/02/14 140 lb (63.5 kg)  02/28/12 143 lb (64.9 kg)  07/02/09 149 lb (67.6 kg)   Ideal Body Weight:  52.27 kg  BMI:  Body mass index is 23.55 kg/m.  Estimated Nutritional Needs:  Kcal:  1500-1700 kcals (25-28 kcal/kg bw) Protein:  66-78g Pro (1.1-1.3g/kg bw) Fluid:  >1.5 L (25 ml/kg bw)  EDUCATION NEEDS:  No education needs identified at this time  Christophe LouisNathan Binyomin Brann RD, LDN, CNSC Clinical Nutrition Pager: [phone] 04/20/2017 10:42 AM

## 2017-04-20 NOTE — Progress Notes (Signed)
.  Cortrak Tube Team Note:  Consult received to place a Cortrak feeding tube.   A 10 F Cortrak tube was placed in the right nare and secured with a nasal bridle at 65 cm. Per the Cortrak monitor reading the tube tip is post pyloric.   No x-ray is required. RN may begin using tube.    If the tube becomes dislodged please keep the tube and contact the Cortrak team at [email or web address] ([email or web address]) for replacement.  If after hours and replacement cannot be delayed, place a NG tube and confirm placement with an abdominal x-ray.    Vanessa Kickarly Naaman Curro RD, LDN Clinical Nutrition Pager # [phone]- [phone]

## 2017-04-20 NOTE — Progress Notes (Addendum)
Name: Theresa Cross MRN: [id number] DOB: 12/22/1924    ADMISSION DATE:  04/19/2017 CONSULTATION DATE:  04/19/2017  REFERRING MD :  Dr. Venetia Maxon  CHIEF COMPLAINT:  SAH  HISTORY OF PRESENT ILLNESS:  83 year old female with PMH as below, who presented to [facility] ED 7/24 after falling out of bed at 1 AM. She presented around 11 AM with chief complaint of confusion and inability to speak. CT of the head demonstrated large volume subarachnoid hemorrhage with intraventricular extension. Also small L frontal SDH. CT angiogram found no clear vascular explanation for hemorrhage. She was admitted by neurosurgery with plans for conservative management. PCCM asked to assist with ICU care.   Of note she has developed dementia, which has progressed to the point she can no longer live alone. Family has made her a DNR  SIGNIFICANT EVENTS  7/24 admit  STUDIES:  CT head 7/24 >Large volume acute subarachnoid hemorrhage with a 4.7 cm pericallosal hematoma. Head CTA is recommended to evaluate for a ruptured pericallosal or other distal ACA aneurysm. 2. Intraventricular extension with small volume blood products. No current evidence of hydrocephalus. 3. Small acute left frontal convexity subdural hematoma. CTA head 7/24 > No arterial or venous abnormality is seen to suggest underlying cause for the observed parenchymal, subarachnoid, and intraventricular hemorrhage. Given the history of fall, the observed findings may be posttraumatic in origin.  Minor extracranial and intracranial atherosclerotic disease, without flow reducing lesion or dissection.  SUBJECTIVE:  No events overnight  VITAL SIGNS: Temp:  [98 F (36.7 C)-99.2 F (37.3 C)] 98 F (36.7 C) (07/25 0400) Pulse Rate:  [66-81] 75 (07/25 0700) Resp:  [13-39] 20 (07/25 0700) BP: (101-171)/(45-118) 113/56 (07/25 0700) SpO2:  [91 %-99 %] 94 % (07/25 0600) Weight:  [60.3 kg (132 lb 15 oz)] 60.3 kg (132 lb 15 oz) (07/24 1842)  PHYSICAL  EXAMINATION: General:  Frail elderly female in NAD Neuro:  Completely unresponsive this AM but reportedly was coughing overnight HEENT:  [place]/AT, PERRL, EOM-I and MMM Cardiovascular:  RRR, Nl S1/S2, -M/R/G. Lungs:  CTA bilaterally Abdomen:  Soft, NT, ND and +BS Musculoskeletal:  No acute deformity Skin:  Grossly intact   Recent Labs Lab 04/19/17 1245 04/19/17 1850 04/20/17 0327  NA 129* 130* 133*  K 4.0 3.8 3.5  CL 95* 95* 97*  CO2 27 22 26   BUN 10 10 12   CREATININE 0.71 0.64 0.65  GLUCOSE 122* 136* 146*    Recent Labs Lab 04/19/17 1245 04/19/17 1850 04/20/17 0327  HGB 13.0 13.8 13.0  HCT 38.5 40.2 38.8  WBC 10.5 12.6* 14.1*  PLT 269 270 296   Ct Angio Head W Or Wo Contrast  Result Date: 04/19/2017 CLINICAL DATA:  Continued surveillance intracranial bleed. Fell during the night. Nonverbal. EXAM: CT ANGIOGRAPHY HEAD AND NECK TECHNIQUE: Multidetector CT imaging of the head and neck was performed using the standard protocol during bolus administration of intravenous contrast. Multiplanar CT image reconstructions and MIPs were obtained to evaluate the vascular anatomy. Carotid stenosis measurements (when applicable) are obtained utilizing NASCET criteria, using the distal internal carotid diameter as the denominator. CONTRAST:  Isovue 370, 50 mL. COMPARISON:  CT head and C-spine earlier today. FINDINGS: CTA NECK Aortic arch: Standard branching. Imaged portion shows no evidence of aneurysm or dissection. No significant stenosis of the major arch vessel origins. Right carotid system: Minor atheromatous change. No evidence of dissection, stenosis (50% or greater) or occlusion. Left carotid system: Minor atheromatous change. No evidence of  dissection, stenosis (50% or greater) or occlusion. Vertebral arteries: LEFT vertebral dominant. No evidence of dissection, stenosis (50% or greater) or occlusion. Nonvascular soft tissues: No lung apex lesion. Developing pulmonary edema. No neck mass.  Cervical spondylosis. CTA HEAD Anterior circulation: Minor atheromatous change of the skull base and supraclinoid internal carotid arteries, predominantly calcific. No proximal stenosis of the anterior or middle cerebral arteries. The anterior cerebral arteries are visualized throughout their A2 and A3 segments up to and beyond the large midline intracranial hemorrhage. No aneurysm is seen. There is no evidence for significant vasculitis. No evidence for vascular malformation. Posterior circulation: LEFT vertebral dominant but both contribute to basilar formation. No significant stenosis. No saccular aneurysm. Venous sinuses: As permitted by contrast timing, patent. Anatomic variants: None of significance. Delayed phase:   No abnormal intracranial enhancement. Comparison with the previous MR from 03/25/2017 shows no underlying abnormality prior to the bleed. Compared with the earlier CT, the hemorrhage is no larger. IMPRESSION: No arterial or venous abnormality is seen to suggest underlying cause for the observed parenchymal, subarachnoid, and intraventricular hemorrhage. Given the history of fall, the observed findings may be posttraumatic in origin. Minor extracranial and intracranial atherosclerotic disease, without flow reducing lesion or dissection. Electronically Signed   By: Elsie StainJohn T Curnes M.D.   On: 04/19/2017 15:21   Ct Head Wo Contrast  Result Date: 04/20/2017 CLINICAL DATA:  83 y/o  F; fall with hemorrhage. EXAM: CT HEAD WITHOUT CONTRAST TECHNIQUE: Contiguous axial images were obtained from the base of the skull through the vertex without intravenous contrast. COMPARISON:  04/19/2017 CT angiogram of head and neck and CT head. FINDINGS: Brain: Stable large pericallosal hematoma, diffuse subarachnoid hemorrhage, left frontal subdural hematoma, and intraventricular hemorrhage. No evidence for new intracranial hemorrhage or large territory stroke. Stable ventricle size. No herniation. Stable background of  chronic microvascular ischemic changes and brain parenchymal volume loss. Vascular: Mild calcific atherosclerosis of carotid siphons. Skull: Normal. Negative for fracture or focal lesion. Sinuses/Orbits: No acute finding. Other: None. IMPRESSION: 1. Stable large pericallosal hematoma, subarachnoid hemorrhage, left frontal subdural hematoma, and intraventricular hemorrhage. 2. Stable ventricle size.  No herniation. 3. No new intracranial hemorrhage or brain parenchymal infarction identified. Electronically Signed   By: Mitzi HansenLance  Furusawa-Stratton M.D.   On: 04/20/2017 05:44   Ct Head Wo Contrast  Result Date: 04/19/2017 CLINICAL DATA:  Larey SeatFell during the night. Altered mental status this morning. Nonverbal. Initial encounter. EXAM: CT HEAD WITHOUT CONTRAST CT CERVICAL SPINE WITHOUT CONTRAST TECHNIQUE: Multidetector CT imaging of the head and cervical spine was performed following the standard protocol without intravenous contrast. Multiplanar CT image reconstructions of the cervical spine were also generated. COMPARISON:  Brain MRI 03/25/2017. Cervical spine radiographs 02/02/2008. FINDINGS: CT HEAD FINDINGS Brain: There is a large volume of acute subarachnoid hemorrhage. A discrete hematoma in the pericallosal region measures 3.7 x 4.7 x 1.8 cm. Subarachnoid hemorrhage is present in multiple sulci along the interhemispheric fissure bilaterally as well as in the predominantly right sylvian fissure. There is intraventricular hemorrhage in the left greater than right lateral ventricles, third ventricle, and fourth ventricle. There is no significant ventricular enlargement compared to the prior MRI to indicate acute hydrocephalus. A small acute subdural hematoma over left frontal convexity measures 6 mm in thickness. No acute large territory infarct is seen. There is no midline shift. Mild cerebral atrophy is noted. Cerebral white matter hypodensities are nonspecific but compatible with chronic small vessel ischemic  disease, mild for age. Vascular: Calcified atherosclerosis at the  skullbase. Skull: No fracture or focal osseous lesion. Sinuses/Orbits: Clear paranasal sinuses. Small chronic left mastoid effusion. Prior right mastoidectomy, canal wall up. Bilateral cataract extraction. Other: None. CT CERVICAL SPINE FINDINGS Alignment: Minimal anterolisthesis of C6 on C7 which appears degenerative. Straightening of the normal cervical lordosis. Skull base and vertebrae: No acute fracture or destructive osseous process is identified. Soft tissues and spinal canal: No prevertebral fluid or swelling. Disc levels: Moderate disc degeneration throughout the mid and lower cervical spine. Moderate upper cervical facet arthrosis. Upper chest: Grossly clear lung apices. Other: Subcentimeter low-density right thyroid nodule. Mild calcified atherosclerosis at the carotid bifurcations. IMPRESSION: 1. Large volume acute subarachnoid hemorrhage with a 4.7 cm pericallosal hematoma. Head CTA is recommended to evaluate for a ruptured pericallosal or other distal ACA aneurysm. 2. Intraventricular extension with small volume blood products. No current evidence of hydrocephalus. 3. Small acute left frontal convexity subdural hematoma. 4. No acute fracture in the cervical spine. Critical Value/emergent results were called by telephone at the time of interpretation on 04/19/2017 at 1:49 pm to Dr. Crista Curb , who verbally acknowledged these results. Electronically Signed   By: Sebastian Ache M.D.   On: 04/19/2017 14:00   Ct Angio Neck W And/or Wo Contrast  Result Date: 04/19/2017 CLINICAL DATA:  Continued surveillance intracranial bleed. Fell during the night. Nonverbal. EXAM: CT ANGIOGRAPHY HEAD AND NECK TECHNIQUE: Multidetector CT imaging of the head and neck was performed using the standard protocol during bolus administration of intravenous contrast. Multiplanar CT image reconstructions and MIPs were obtained to evaluate the vascular anatomy. Carotid  stenosis measurements (when applicable) are obtained utilizing NASCET criteria, using the distal internal carotid diameter as the denominator. CONTRAST:  Isovue 370, 50 mL. COMPARISON:  CT head and C-spine earlier today. FINDINGS: CTA NECK Aortic arch: Standard branching. Imaged portion shows no evidence of aneurysm or dissection. No significant stenosis of the major arch vessel origins. Right carotid system: Minor atheromatous change. No evidence of dissection, stenosis (50% or greater) or occlusion. Left carotid system: Minor atheromatous change. No evidence of dissection, stenosis (50% or greater) or occlusion. Vertebral arteries: LEFT vertebral dominant. No evidence of dissection, stenosis (50% or greater) or occlusion. Nonvascular soft tissues: No lung apex lesion. Developing pulmonary edema. No neck mass. Cervical spondylosis. CTA HEAD Anterior circulation: Minor atheromatous change of the skull base and supraclinoid internal carotid arteries, predominantly calcific. No proximal stenosis of the anterior or middle cerebral arteries. The anterior cerebral arteries are visualized throughout their A2 and A3 segments up to and beyond the large midline intracranial hemorrhage. No aneurysm is seen. There is no evidence for significant vasculitis. No evidence for vascular malformation. Posterior circulation: LEFT vertebral dominant but both contribute to basilar formation. No significant stenosis. No saccular aneurysm. Venous sinuses: As permitted by contrast timing, patent. Anatomic variants: None of significance. Delayed phase:   No abnormal intracranial enhancement. Comparison with the previous MR from 03/25/2017 shows no underlying abnormality prior to the bleed. Compared with the earlier CT, the hemorrhage is no larger. IMPRESSION: No arterial or venous abnormality is seen to suggest underlying cause for the observed parenchymal, subarachnoid, and intraventricular hemorrhage. Given the history of fall, the  observed findings may be posttraumatic in origin. Minor extracranial and intracranial atherosclerotic disease, without flow reducing lesion or dissection. Electronically Signed   By: Elsie Stain M.D.   On: 04/19/2017 15:21   Ct Cervical Spine Wo Contrast  Result Date: 04/19/2017 CLINICAL DATA:  Larey Seat during the night. Altered mental status  this morning. Nonverbal. Initial encounter. EXAM: CT HEAD WITHOUT CONTRAST CT CERVICAL SPINE WITHOUT CONTRAST TECHNIQUE: Multidetector CT imaging of the head and cervical spine was performed following the standard protocol without intravenous contrast. Multiplanar CT image reconstructions of the cervical spine were also generated. COMPARISON:  Brain MRI 03/25/2017. Cervical spine radiographs 02/02/2008. FINDINGS: CT HEAD FINDINGS Brain: There is a large volume of acute subarachnoid hemorrhage. A discrete hematoma in the pericallosal region measures 3.7 x 4.7 x 1.8 cm. Subarachnoid hemorrhage is present in multiple sulci along the interhemispheric fissure bilaterally as well as in the predominantly right sylvian fissure. There is intraventricular hemorrhage in the left greater than right lateral ventricles, third ventricle, and fourth ventricle. There is no significant ventricular enlargement compared to the prior MRI to indicate acute hydrocephalus. A small acute subdural hematoma over left frontal convexity measures 6 mm in thickness. No acute large territory infarct is seen. There is no midline shift. Mild cerebral atrophy is noted. Cerebral white matter hypodensities are nonspecific but compatible with chronic small vessel ischemic disease, mild for age. Vascular: Calcified atherosclerosis at the skullbase. Skull: No fracture or focal osseous lesion. Sinuses/Orbits: Clear paranasal sinuses. Small chronic left mastoid effusion. Prior right mastoidectomy, canal wall up. Bilateral cataract extraction. Other: None. CT CERVICAL SPINE FINDINGS Alignment: Minimal anterolisthesis  of C6 on C7 which appears degenerative. Straightening of the normal cervical lordosis. Skull base and vertebrae: No acute fracture or destructive osseous process is identified. Soft tissues and spinal canal: No prevertebral fluid or swelling. Disc levels: Moderate disc degeneration throughout the mid and lower cervical spine. Moderate upper cervical facet arthrosis. Upper chest: Grossly clear lung apices. Other: Subcentimeter low-density right thyroid nodule. Mild calcified atherosclerosis at the carotid bifurcations. IMPRESSION: 1. Large volume acute subarachnoid hemorrhage with a 4.7 cm pericallosal hematoma. Head CTA is recommended to evaluate for a ruptured pericallosal or other distal ACA aneurysm. 2. Intraventricular extension with small volume blood products. No current evidence of hydrocephalus. 3. Small acute left frontal convexity subdural hematoma. 4. No acute fracture in the cervical spine. Critical Value/emergent results were called by telephone at the time of interpretation on 04/19/2017 at 1:49 pm to Dr. Crista CurbANA LIU , who verbally acknowledged these results. Electronically Signed   By: Sebastian AcheAllen  Grady M.D.   On: 04/19/2017 14:00   Dg Chest Port 1 View  Result Date: 04/19/2017 CLINICAL DATA:  Recent fall with altered mental status, initial encounter EXAM: PORTABLE CHEST 1 VIEW COMPARISON:  None. FINDINGS: Cardiac shadow is mildly prominent but accentuated by the portable technique. Aortic calcifications are seen. The overall inspiratory effort is poor without focal infiltrate. Degenerative changes of the thoracolumbar spine are seen. IMPRESSION: No active disease. Electronically Signed   By: Alcide CleverMark  Lukens M.D.   On: 04/19/2017 18:32   I reviewed head CT myself, no change in size of bleed  ASSESSMENT / PLAN:  Traumatic subarachnoid hemorrhage with intraventricular extension Left frontal SDH, small.  - Management per neurosurgery/neurology - Frequent neuro checks - Keppra - If no improvement  family would lean towards palliation.  - Hold in ICU today while on drip - BP control as below  Hypertension: on admission, no history of this - SBP goal <140 - Clevidipine infusion as needed, down to 3 mg/hr - Insert cortrak - Start norvasc 5 mg PO daily with holding parameters to hopefully get off drip  Leukocytosis: likely stress reponse - Monitor WBC and fever curve - Hold of abx for now  Dementia - On Depakote, hold  while NPO - Holding alprazolam, sertraline.  Nutrition: - NPO - Maintenance fluides NS @ 50mL /Hr, will d/c once TF are at goal - Insert cortrak - Consult nutrition for TF  Hypokalemia: - KCl 40 meq IV - BMET in AM  Spoke with son, DNR with no intubation/CPR/cardioversion/pressors but NGT ok for TF.  If no improvement in mental status may need to consider palliation.  Discussed with bedside RN and PCCM-NP.  The patient is critically ill with multiple organ systems failure and requires high complexity decision making for assessment and support, frequent evaluation and titration of therapies, application of advanced monitoring technologies and extensive interpretation of multiple databases.   Critical Care Time devoted to patient care services described in this note is  35  Minutes. This time reflects time of care of this signee Dr Koren Bound. This critical care time does not reflect procedure time, or teaching time or supervisory time of PA/NP/Med student/Med Resident etc but could involve care discussion time.  Alyson Reedy, M.D. [facility] Pulmonary/Critical Care Medicine. Pager: [phone]. After hours pager: [phone].  04/20/2017 8:10 AM

## 2017-04-20 NOTE — Progress Notes (Signed)
STROKE TEAM PROGRESS NOTE   HISTORY OF PRESENT ILLNESS (per record) 83 year old female brought in by EMS for altered mental status. History of dementia, arthritis and osteoporosis. No blood thinners. Fall out of bed at 1 AM on 7.25.2018 with resultant traumatic paracollosal SDH and traumatic SAH with extension of blood across falx into left ventricle.  EMS was called and felt she was well on scene.  At 11 AM family noted she was confused, non-verbal. At baseline very functional. Patient has been progressively developing dementia and can no longer live alone.  She has been made a DNR by her family.  Neurosurgery has evaluated, and patient is not a neurosurgical candidate.  SBP goal < for SAH.  Family meeting at 0900 on 04/21/2017 to discuss goals of care.    Patient was not administered IV t-PA secondary to ICH. She was admitted to the neuro ICU for further evaluation and treatment.   SUBJECTIVE (INTERVAL HISTORY) Her son is at the bedside.  The patient is drowsy, but opens eyes to noxious stimuli.   OBJECTIVE Temp:  [97.6 F (36.4 C)-99 F (37.2 C)] 98.7 F (37.1 C) (07/25 1600) Pulse Rate:  [60-81] 73 (07/25 1700) Cardiac Rhythm: Normal sinus rhythm (07/25 0800) Resp:  [13-42] 36 (07/25 1700) BP: (101-165)/(39-118) 130/64 (07/25 1700) SpO2:  [91 %-97 %] 97 % (07/25 1700) Weight:  [60.3 kg (132 lb 15 oz)] 60.3 kg (132 lb 15 oz) (07/24 1842)  CBC:  Recent Labs Lab 04/19/17 1245 04/19/17 1850 04/20/17 0327  WBC 10.5 12.6* 14.1*  NEUTROABS 8.2*  --   --   HGB 13.0 13.8 13.0  HCT 38.5 40.2 38.8  MCV 84.4 84.3 84.2  PLT 269 270 296    Basic Metabolic Panel:  Recent Labs Lab 04/19/17 1850 04/20/17 0327  NA 130* 133*  K 3.8 3.5  CL 95* 97*  CO2 22 26  GLUCOSE 136* 146*  BUN 10 12  CREATININE 0.64 0.65  CALCIUM 9.0 9.2    Lipid Panel: No results found for: CHOL, TRIG, HDL, CHOLHDL, VLDL, LDLCALC HgbA1c: No results found for: HGBA1C Urine Drug Screen: No results  found for: LABOPIA, COCAINSCRNUR, LABBENZ, AMPHETMU, THCU, LABBARB  Alcohol Level No results found for: ETH  IMAGING  Ct Angio Head W Or Wo Contrast Ct Angio Neck W And/or Wo Contrast 04/19/2017 IMPRESSION: No arterial or venous abnormality is seen to suggest underlying cause for the observed parenchymal, subarachnoid, and intraventricular hemorrhage. Given the history of fall, the observed findings may be posttraumatic in origin. Minor extracranial and intracranial atherosclerotic disease, without flow reducing lesion or dissection.  Ct Head Wo Contrast 04/20/2017 IMPRESSION: 1. Stable large pericallosal hematoma, subarachnoid hemorrhage, left frontal subdural hematoma, and intraventricular hemorrhage. 2. Stable ventricle size.  No herniation. 3. No new intracranial hemorrhage or brain parenchymal infarction identified.   Ct Head Wo Contrast 04/19/2017 IMPRESSION: 1. Large volume acute subarachnoid hemorrhage with a 4.7 cm pericallosal hematoma. Head CTA is recommended to evaluate for a ruptured pericallosal or other distal ACA aneurysm. 2. Intraventricular extension with small volume blood products. No current evidence of hydrocephalus. 3. Small acute left frontal convexity subdural hematoma. 4. No acute fracture in the cervical spine.  Ct Cervical Spine Wo Contrast  04/19/2017 IMPRESSION: 1. Large volume acute subarachnoid hemorrhage with a 4.7 cm pericallosal hematoma. Head CTA is recommended to evaluate for a ruptured pericallosal or other distal ACA aneurysm. 2. Intraventricular extension with small volume blood products. No current evidence of hydrocephalus. 3.  Small acute left frontal convexity subdural hematoma. 4. No acute fracture in the cervical spine.   Dg Chest Port 1 View 04/19/2017 IMPRESSION: No active disease.  Dg Abd Portable 1v 04/20/2017 IMPRESSION: The feeding tube tip lies in the region of the pylorus.       PHYSICAL EXAM Frail  elderly lady not in distress. .  Afebrile. Head is nontraumatic. Neck is supple without bruit.    Cardiac exam no murmur or gallop. Lungs are clear to auscultation. Distal pulses are well felt.  Neurological Exam :   Drowsy but can open eyes when stimulated. Pupils small 2 mm reactive. Doll's  She mutters few incomprehensible words but does not follow any commands Mild left lower facial weakness. Mild left hemiparesis but did with draw slightly on left to painful stimuli and briskly on right side  to painful stimuli. Deep tendon reflexes are symmetric. Both Plantars are withdrawal response. Gait deferred ASSESSMENT/PLAN Ms. Doretha SouFlora P Fawcett is a 83 y.o. female with history of dementia, arthritis and osteoporosis presenting with traumatic SDH and traumatic SAH secondary to mechanical fall. She did not receive IV t-PA due to ICH.   Stroke: Traumatic paracollosal SDH and traumatic SAH with parafalcine extension and left IVH secondary to mechanical fall.  Resultant aphasia and left hemiparesis.  CT head: Stable large pericallosal hematoma, subarachnoid hemorrhage, left frontal subdural hematoma, and intraventricular hemorrhage.  CTA head/neck: Unremarkable  SCDs for VTE prophylaxis  Diet NPO time specified  No antithrombotic prior to admission, now on No antithrombotic  Therapy recommendations: none  Disposition:   ending  Hypertension  Stable  Goal SBP < 140 mmHg for SAH  Unsuccessful wean from clevidipine gtt today  Long-term BP goal normotensive  SAH   [id number]  No ICP monitoring  Goal SBP < 140  TCDs not ordered; comfort care likely  Nimodipine discontinued; comfort care likely  Keppra 500mg  IV BID for seizure prophylaxis  Family meeting at 0900 tomorrow to discuss goals of care  Son has indicated he wants comfort care, but wants other family present for discussion first   Other Active Problems  None  Hospital day # 1  I have personally examined this patient, reviewed notes, independently viewed  imaging studies, participated in medical decision making and plan of care.ROS completed by me personally and pertinent positives fully documented  I have made any additions or clarifications directly to the above note.  The patient's prognosis is poor given intracerebral hemorrhage, cytotoxic edema and intraventricular extension. She is unlikely to survive without prolonged ventilatory support, feeding tube and nursing home care. Patient's  Son is realistic and would like to do support her for next do to but does not want feeding tube, intubation or nursing home care. Recommend palliative care consult. Continue strict control of blood pressure with systolic blood pressure go below 160. Start Norvasc and use labetalol p.r.n. And taper Cardene drip as possible.long discussion with the son and the bedside and with Dr. Venetia MaxonStern This patient is critically ill and at significant risk of neurological worsening, death and care requires constant monitoring of vital signs, hemodynamics,respiratory and cardiac monitoring, extensive review of multiple databases, frequent neurological assessment, discussion with family, other specialists and medical decision making of high complexity.I have made any additions or clarifications directly to the above note.This critical care time does not reflect procedure time, or teaching time or supervisory time of PA/NP/Med Resident etc but could involve care discussion time.  I spent 30 minutes of neurocritical care time  in the care of  this patient.     Delia HeadyPramod Sethi, MD Medical Director [facility] Pager: [phone] 04/20/2017 8:31 PM   To contact Stroke Continuity provider, please refer to [email or web address]. After hours, contact General Neurology

## 2017-04-20 NOTE — Progress Notes (Signed)
SLP Cancellation Note  Patient Details Name: Theresa Cross MRN: [id number] DOB: 04/26/1925   Cancelled treatment:       Reason Eval/Treat Not Completed: Medical issues which prohibited therapy. RN reported pt minimally alert today, not following commands. Bedside swallow eval was cancelled by MD for now, will f/u next date to reattempt cognitive-linguistic eval.    Metro KungAmy K Oleksiak, MA, CCC-SLP 04/20/2017, 1:25 PM  [id number]

## 2017-04-20 NOTE — Progress Notes (Signed)
Pt vomited 2nd time.  MD notified and gave orders not to start tube feedings.  MD also stated to try and give next round of PO medications through tube and if pt keeps them down then start tube feedings.

## 2017-04-20 NOTE — Progress Notes (Signed)
Cortrack flushed and meds given.  Pt immediately regurgitated bile with meds and fluid. MD notified and gave order for portable ABD x-ray. Cleviprex restarted. RN to continue to monitor.

## 2017-04-20 NOTE — Progress Notes (Addendum)
Subjective: Patient reports opens eyes, but does not speak  Objective: Vital signs in last 24 hours: Temp:  [98 F (36.7 C)-99.2 F (37.3 C)] 98 F (36.7 C) (07/25 0400) Pulse Rate:  [66-81] 75 (07/25 0700) Resp:  [13-39] 20 (07/25 0700) BP: (101-171)/(45-118) 113/56 (07/25 0700) SpO2:  [91 %-99 %] 94 % (07/25 0600) Weight:  [60.3 kg (132 lb 15 oz)] 60.3 kg (132 lb 15 oz) (07/24 1842)  Intake/Output from previous day: 07/24 0701 - 07/25 0700 In: 698.8 [I.V.:593.8; IV Piggyback:105] Out: 300 [Urine:300] Intake/Output this shift: No intake/output data recorded.  Resting quietly. Son present. Pt does open eys to touch and attends, but does not respond verbally.  Son notes restful night.  CT this AM stable.   Lab Results:  Recent Labs  04/19/17 1850 04/20/17 0327  WBC 12.6* 14.1*  HGB 13.8 13.0  HCT 40.2 38.8  PLT 270 296   BMET  Recent Labs  04/19/17 1850 04/20/17 0327  NA 130* 133*  K 3.8 3.5  CL 95* 97*  CO2 22 26  GLUCOSE 136* 146*  BUN 10 12  CREATININE 0.64 0.65  CALCIUM 9.0 9.2    Studies/Results: Ct Angio Head W Or Wo Contrast  Result Date: 04/19/2017 CLINICAL DATA:  Continued surveillance intracranial bleed. Fell during the night. Nonverbal. EXAM: CT ANGIOGRAPHY HEAD AND NECK TECHNIQUE: Multidetector CT imaging of the head and neck was performed using the standard protocol during bolus administration of intravenous contrast. Multiplanar CT image reconstructions and MIPs were obtained to evaluate the vascular anatomy. Carotid stenosis measurements (when applicable) are obtained utilizing NASCET criteria, using the distal internal carotid diameter as the denominator. CONTRAST:  Isovue 370, 50 mL. COMPARISON:  CT head and C-spine earlier today. FINDINGS: CTA NECK Aortic arch: Standard branching. Imaged portion shows no evidence of aneurysm or dissection. No significant stenosis of the major arch vessel origins. Right carotid system: Minor atheromatous change.  No evidence of dissection, stenosis (50% or greater) or occlusion. Left carotid system: Minor atheromatous change. No evidence of dissection, stenosis (50% or greater) or occlusion. Vertebral arteries: LEFT vertebral dominant. No evidence of dissection, stenosis (50% or greater) or occlusion. Nonvascular soft tissues: No lung apex lesion. Developing pulmonary edema. No neck mass. Cervical spondylosis. CTA HEAD Anterior circulation: Minor atheromatous change of the skull base and supraclinoid internal carotid arteries, predominantly calcific. No proximal stenosis of the anterior or middle cerebral arteries. The anterior cerebral arteries are visualized throughout their A2 and A3 segments up to and beyond the large midline intracranial hemorrhage. No aneurysm is seen. There is no evidence for significant vasculitis. No evidence for vascular malformation. Posterior circulation: LEFT vertebral dominant but both contribute to basilar formation. No significant stenosis. No saccular aneurysm. Venous sinuses: As permitted by contrast timing, patent. Anatomic variants: None of significance. Delayed phase:   No abnormal intracranial enhancement. Comparison with the previous MR from 03/25/2017 shows no underlying abnormality prior to the bleed. Compared with the earlier CT, the hemorrhage is no larger. IMPRESSION: No arterial or venous abnormality is seen to suggest underlying cause for the observed parenchymal, subarachnoid, and intraventricular hemorrhage. Given the history of fall, the observed findings may be posttraumatic in origin. Minor extracranial and intracranial atherosclerotic disease, without flow reducing lesion or dissection. Electronically Signed   By: Elsie Stain M.D.   On: 04/19/2017 15:21   Ct Head Wo Contrast  Result Date: 04/20/2017 CLINICAL DATA:  83 y/o  F; fall with hemorrhage. EXAM: CT HEAD WITHOUT CONTRAST TECHNIQUE:  Contiguous axial images were obtained from the base of the skull through the  vertex without intravenous contrast. COMPARISON:  04/19/2017 CT angiogram of head and neck and CT head. FINDINGS: Brain: Stable large pericallosal hematoma, diffuse subarachnoid hemorrhage, left frontal subdural hematoma, and intraventricular hemorrhage. No evidence for new intracranial hemorrhage or large territory stroke. Stable ventricle size. No herniation. Stable background of chronic microvascular ischemic changes and brain parenchymal volume loss. Vascular: Mild calcific atherosclerosis of carotid siphons. Skull: Normal. Negative for fracture or focal lesion. Sinuses/Orbits: No acute finding. Other: None. IMPRESSION: 1. Stable large pericallosal hematoma, subarachnoid hemorrhage, left frontal subdural hematoma, and intraventricular hemorrhage. 2. Stable ventricle size.  No herniation. 3. No new intracranial hemorrhage or brain parenchymal infarction identified. Electronically Signed   By: Mitzi HansenLance  Furusawa-Stratton M.D.   On: 04/20/2017 05:44   Ct Head Wo Contrast  Result Date: 04/19/2017 CLINICAL DATA:  Larey SeatFell during the night. Altered mental status this morning. Nonverbal. Initial encounter. EXAM: CT HEAD WITHOUT CONTRAST CT CERVICAL SPINE WITHOUT CONTRAST TECHNIQUE: Multidetector CT imaging of the head and cervical spine was performed following the standard protocol without intravenous contrast. Multiplanar CT image reconstructions of the cervical spine were also generated. COMPARISON:  Brain MRI 03/25/2017. Cervical spine radiographs 02/02/2008. FINDINGS: CT HEAD FINDINGS Brain: There is a large volume of acute subarachnoid hemorrhage. A discrete hematoma in the pericallosal region measures 3.7 x 4.7 x 1.8 cm. Subarachnoid hemorrhage is present in multiple sulci along the interhemispheric fissure bilaterally as well as in the predominantly right sylvian fissure. There is intraventricular hemorrhage in the left greater than right lateral ventricles, third ventricle, and fourth ventricle. There is no  significant ventricular enlargement compared to the prior MRI to indicate acute hydrocephalus. A small acute subdural hematoma over left frontal convexity measures 6 mm in thickness. No acute large territory infarct is seen. There is no midline shift. Mild cerebral atrophy is noted. Cerebral white matter hypodensities are nonspecific but compatible with chronic small vessel ischemic disease, mild for age. Vascular: Calcified atherosclerosis at the skullbase. Skull: No fracture or focal osseous lesion. Sinuses/Orbits: Clear paranasal sinuses. Small chronic left mastoid effusion. Prior right mastoidectomy, canal wall up. Bilateral cataract extraction. Other: None. CT CERVICAL SPINE FINDINGS Alignment: Minimal anterolisthesis of C6 on C7 which appears degenerative. Straightening of the normal cervical lordosis. Skull base and vertebrae: No acute fracture or destructive osseous process is identified. Soft tissues and spinal canal: No prevertebral fluid or swelling. Disc levels: Moderate disc degeneration throughout the mid and lower cervical spine. Moderate upper cervical facet arthrosis. Upper chest: Grossly clear lung apices. Other: Subcentimeter low-density right thyroid nodule. Mild calcified atherosclerosis at the carotid bifurcations. IMPRESSION: 1. Large volume acute subarachnoid hemorrhage with a 4.7 cm pericallosal hematoma. Head CTA is recommended to evaluate for a ruptured pericallosal or other distal ACA aneurysm. 2. Intraventricular extension with small volume blood products. No current evidence of hydrocephalus. 3. Small acute left frontal convexity subdural hematoma. 4. No acute fracture in the cervical spine. Critical Value/emergent results were called by telephone at the time of interpretation on 04/19/2017 at 1:49 pm to Dr. Crista CurbANA LIU , who verbally acknowledged these results. Electronically Signed   By: Sebastian AcheAllen  Grady M.D.   On: 04/19/2017 14:00   Ct Angio Neck W And/or Wo Contrast  Result Date:  04/19/2017 CLINICAL DATA:  Continued surveillance intracranial bleed. Fell during the night. Nonverbal. EXAM: CT ANGIOGRAPHY HEAD AND NECK TECHNIQUE: Multidetector CT imaging of the head and neck was performed using the standard  protocol during bolus administration of intravenous contrast. Multiplanar CT image reconstructions and MIPs were obtained to evaluate the vascular anatomy. Carotid stenosis measurements (when applicable) are obtained utilizing NASCET criteria, using the distal internal carotid diameter as the denominator. CONTRAST:  Isovue 370, 50 mL. COMPARISON:  CT head and C-spine earlier today. FINDINGS: CTA NECK Aortic arch: Standard branching. Imaged portion shows no evidence of aneurysm or dissection. No significant stenosis of the major arch vessel origins. Right carotid system: Minor atheromatous change. No evidence of dissection, stenosis (50% or greater) or occlusion. Left carotid system: Minor atheromatous change. No evidence of dissection, stenosis (50% or greater) or occlusion. Vertebral arteries: LEFT vertebral dominant. No evidence of dissection, stenosis (50% or greater) or occlusion. Nonvascular soft tissues: No lung apex lesion. Developing pulmonary edema. No neck mass. Cervical spondylosis. CTA HEAD Anterior circulation: Minor atheromatous change of the skull base and supraclinoid internal carotid arteries, predominantly calcific. No proximal stenosis of the anterior or middle cerebral arteries. The anterior cerebral arteries are visualized throughout their A2 and A3 segments up to and beyond the large midline intracranial hemorrhage. No aneurysm is seen. There is no evidence for significant vasculitis. No evidence for vascular malformation. Posterior circulation: LEFT vertebral dominant but both contribute to basilar formation. No significant stenosis. No saccular aneurysm. Venous sinuses: As permitted by contrast timing, patent. Anatomic variants: None of significance. Delayed phase:    No abnormal intracranial enhancement. Comparison with the previous MR from 03/25/2017 shows no underlying abnormality prior to the bleed. Compared with the earlier CT, the hemorrhage is no larger. IMPRESSION: No arterial or venous abnormality is seen to suggest underlying cause for the observed parenchymal, subarachnoid, and intraventricular hemorrhage. Given the history of fall, the observed findings may be posttraumatic in origin. Minor extracranial and intracranial atherosclerotic disease, without flow reducing lesion or dissection. Electronically Signed   By: Elsie StainJohn T Curnes M.D.   On: 04/19/2017 15:21   Ct Cervical Spine Wo Contrast  Result Date: 04/19/2017 CLINICAL DATA:  Larey SeatFell during the night. Altered mental status this morning. Nonverbal. Initial encounter. EXAM: CT HEAD WITHOUT CONTRAST CT CERVICAL SPINE WITHOUT CONTRAST TECHNIQUE: Multidetector CT imaging of the head and cervical spine was performed following the standard protocol without intravenous contrast. Multiplanar CT image reconstructions of the cervical spine were also generated. COMPARISON:  Brain MRI 03/25/2017. Cervical spine radiographs 02/02/2008. FINDINGS: CT HEAD FINDINGS Brain: There is a large volume of acute subarachnoid hemorrhage. A discrete hematoma in the pericallosal region measures 3.7 x 4.7 x 1.8 cm. Subarachnoid hemorrhage is present in multiple sulci along the interhemispheric fissure bilaterally as well as in the predominantly right sylvian fissure. There is intraventricular hemorrhage in the left greater than right lateral ventricles, third ventricle, and fourth ventricle. There is no significant ventricular enlargement compared to the prior MRI to indicate acute hydrocephalus. A small acute subdural hematoma over left frontal convexity measures 6 mm in thickness. No acute large territory infarct is seen. There is no midline shift. Mild cerebral atrophy is noted. Cerebral white matter hypodensities are nonspecific but  compatible with chronic small vessel ischemic disease, mild for age. Vascular: Calcified atherosclerosis at the skullbase. Skull: No fracture or focal osseous lesion. Sinuses/Orbits: Clear paranasal sinuses. Small chronic left mastoid effusion. Prior right mastoidectomy, canal wall up. Bilateral cataract extraction. Other: None. CT CERVICAL SPINE FINDINGS Alignment: Minimal anterolisthesis of C6 on C7 which appears degenerative. Straightening of the normal cervical lordosis. Skull base and vertebrae: No acute fracture or destructive osseous process is identified. Soft  tissues and spinal canal: No prevertebral fluid or swelling. Disc levels: Moderate disc degeneration throughout the mid and lower cervical spine. Moderate upper cervical facet arthrosis. Upper chest: Grossly clear lung apices. Other: Subcentimeter low-density right thyroid nodule. Mild calcified atherosclerosis at the carotid bifurcations. IMPRESSION: 1. Large volume acute subarachnoid hemorrhage with a 4.7 cm pericallosal hematoma. Head CTA is recommended to evaluate for a ruptured pericallosal or other distal ACA aneurysm. 2. Intraventricular extension with small volume blood products. No current evidence of hydrocephalus. 3. Small acute left frontal convexity subdural hematoma. 4. No acute fracture in the cervical spine. Critical Value/emergent results were called by telephone at the time of interpretation on 04/19/2017 at 1:49 pm to Dr. Crista Curb , who verbally acknowledged these results. Electronically Signed   By: Sebastian Ache M.D.   On: 04/19/2017 14:00   Dg Chest Port 1 View  Result Date: 04/19/2017 CLINICAL DATA:  Recent fall with altered mental status, initial encounter EXAM: PORTABLE CHEST 1 VIEW COMPARISON:  None. FINDINGS: Cardiac shadow is mildly prominent but accentuated by the portable technique. Aortic calcifications are seen. The overall inspiratory effort is poor without focal infiltrate. Degenerative changes of the thoracolumbar  spine are seen. IMPRESSION: No active disease. Electronically Signed   By: Alcide Clever M.D.   On: 04/19/2017 18:32    Assessment/Plan:   LOS: 1 day  Plan to stay in ICU today. Continue supportive care. Repeat CT shows no new bleed, stable.    Georgiann Cocker 04/20/2017, 8:02 AM   Repeat CT is stable.  Patient is a bit more awake today, but prognosis for meaningful/independent recovery is guarded.  Patient has hyponatremia, which we will follow and which we are managing with NS IVF.

## 2017-04-21 DIAGNOSIS — S066X0A Traumatic subarachnoid hemorrhage without loss of consciousness, initial encounter: Principal | ICD-10-CM

## 2017-04-21 DIAGNOSIS — Z7189 Other specified counseling: Secondary | ICD-10-CM

## 2017-04-21 DIAGNOSIS — Z515 Encounter for palliative care: Secondary | ICD-10-CM

## 2017-04-21 LAB — BASIC METABOLIC PANEL
Anion gap: 9 (ref 5–15)
BUN: 17 mg/dL (ref 6–20)
CALCIUM: 8.8 mg/dL — AB (ref 8.9–10.3)
CO2: 25 mmol/L (ref 22–32)
CREATININE: 0.52 mg/dL (ref 0.44–1.00)
Chloride: 98 mmol/L — ABNORMAL LOW (ref 101–111)
GFR calc non Af Amer: >60 mL/min (ref >60)
Glucose, Bld: 129 mg/dL — ABNORMAL HIGH (ref 65–99)
Potassium: 3.4 mmol/L — ABNORMAL LOW (ref 3.5–5.1)
SODIUM: 132 mmol/L — AB (ref 135–145)

## 2017-04-21 LAB — GLUCOSE, CAPILLARY
GLUCOSE-CAPILLARY: 112 mg/dL — AB (ref 65–99)
Glucose-Capillary: 132 mg/dL — ABNORMAL HIGH (ref 65–99)

## 2017-04-21 LAB — CBC
HCT: 35.8 % — ABNORMAL LOW (ref 36.0–46.0)
Hemoglobin: 11.9 g/dL — ABNORMAL LOW (ref 12.0–15.0)
MCH: 28.3 pg (ref 26.0–34.0)
MCHC: 33.2 g/dL (ref 30.0–36.0)
MCV: 85 fL (ref 78.0–100.0)
Platelets: 271 10*3/uL (ref 150–400)
RBC: 4.21 MIL/uL (ref 3.87–5.11)
RDW: 13.6 % (ref 11.5–15.5)
WBC: 16.6 10*3/uL — ABNORMAL HIGH (ref 4.0–10.5)

## 2017-04-21 LAB — MAGNESIUM: MAGNESIUM: 2 mg/dL (ref 1.7–2.4)

## 2017-04-21 LAB — PHOSPHORUS: PHOSPHORUS: 2.7 mg/dL (ref 2.5–4.6)

## 2017-04-21 LAB — TRIGLYCERIDES: Triglycerides: 68 mg/dL (ref <150)

## 2017-04-21 MED ORDER — HALOPERIDOL LACTATE 5 MG/ML IJ SOLN
0.5000 mg | Freq: Four times a day (QID) | INTRAMUSCULAR | Status: DC | PRN
Start: 2017-04-21 — End: 2017-04-22

## 2017-04-21 MED ORDER — POLYVINYL ALCOHOL 1.4 % OP SOLN
1.0000 [drp] | Freq: Four times a day (QID) | OPHTHALMIC | Status: DC | PRN
  Filled 2017-04-21: qty 15

## 2017-04-21 MED ORDER — BISACODYL 10 MG RE SUPP: 10.0000 mg | Freq: Every day | RECTAL | Status: DC | PRN

## 2017-04-21 MED ORDER — BIOTENE DRY MOUTH MT LIQD: 15.0000 mL | OROMUCOSAL | Status: DC | PRN

## 2017-04-21 MED ORDER — DIPHENHYDRAMINE HCL 50 MG/ML IJ SOLN: 12.5000 mg | Freq: Four times a day (QID) | INTRAMUSCULAR | Status: DC | PRN

## 2017-04-21 MED ORDER — GLYCOPYRROLATE 0.2 MG/ML IJ SOLN: 0.2000 mg | INTRAMUSCULAR | Status: DC | PRN

## 2017-04-21 MED ORDER — MORPHINE SULFATE (PF) 4 MG/ML IV SOLN: 2.0000 mg | INTRAVENOUS | Status: DC | PRN

## 2017-04-21 MED ORDER — ONDANSETRON HCL 4 MG/2ML IJ SOLN: 4.0000 mg | Freq: Four times a day (QID) | INTRAMUSCULAR | Status: DC | PRN

## 2017-04-21 NOTE — Progress Notes (Addendum)
Subjective: Patient reports resting quietly  Objective: Vital signs in last 24 hours: Temp:  [97.9 F (36.6 C)-98.7 F (37.1 C)] 98.7 F (37.1 C) (07/26 0400) Pulse Rate:  [59-82] 63 (07/26 0700) Resp:  [13-42] 15 (07/26 0700) BP: (99-170)/(39-108) 137/69 (07/26 0700) SpO2:  [93 %-100 %] 99 % (07/26 0700) Weight:  [61.9 kg (136 lb 7.4 oz)] 61.9 kg (136 lb 7.4 oz) (07/26 0318)  Intake/Output from previous day: 07/25 0701 - 07/26 0700 In: 1816 [I.V.:1306; IV Piggyback:510] Out: 875 [Urine:875] Intake/Output this shift: No intake/output data recorded.  Resting quietly. Family present reports no issues overnight. Stirs to noxious stimulation.   Lab Results:  Recent Labs  04/20/17 0327 04/21/17 0209  WBC 14.1* 16.6*  HGB 13.0 11.9*  HCT 38.8 35.8*  PLT 296 271   BMET  Recent Labs  04/20/17 0327 04/21/17 0209  NA 133* 132*  K 3.5 3.4*  CL 97* 98*  CO2 26 25  GLUCOSE 146* 129*  BUN 12 17  CREATININE 0.65 0.52  CALCIUM 9.2 8.8*    Studies/Results: Ct Angio Head W Or Wo Contrast  Result Date: 04/19/2017 CLINICAL DATA:  Continued surveillance intracranial bleed. Fell during the night. Nonverbal. EXAM: CT ANGIOGRAPHY HEAD AND NECK TECHNIQUE: Multidetector CT imaging of the head and neck was performed using the standard protocol during bolus administration of intravenous contrast. Multiplanar CT image reconstructions and MIPs were obtained to evaluate the vascular anatomy. Carotid stenosis measurements (when applicable) are obtained utilizing NASCET criteria, using the distal internal carotid diameter as the denominator. CONTRAST:  Isovue 370, 50 mL. COMPARISON:  CT head and C-spine earlier today. FINDINGS: CTA NECK Aortic arch: Standard branching. Imaged portion shows no evidence of aneurysm or dissection. No significant stenosis of the major arch vessel origins. Right carotid system: Minor atheromatous change. No evidence of dissection, stenosis (50% or greater) or  occlusion. Left carotid system: Minor atheromatous change. No evidence of dissection, stenosis (50% or greater) or occlusion. Vertebral arteries: LEFT vertebral dominant. No evidence of dissection, stenosis (50% or greater) or occlusion. Nonvascular soft tissues: No lung apex lesion. Developing pulmonary edema. No neck mass. Cervical spondylosis. CTA HEAD Anterior circulation: Minor atheromatous change of the skull base and supraclinoid internal carotid arteries, predominantly calcific. No proximal stenosis of the anterior or middle cerebral arteries. The anterior cerebral arteries are visualized throughout their A2 and A3 segments up to and beyond the large midline intracranial hemorrhage. No aneurysm is seen. There is no evidence for significant vasculitis. No evidence for vascular malformation. Posterior circulation: LEFT vertebral dominant but both contribute to basilar formation. No significant stenosis. No saccular aneurysm. Venous sinuses: As permitted by contrast timing, patent. Anatomic variants: None of significance. Delayed phase:   No abnormal intracranial enhancement. Comparison with the previous MR from 03/25/2017 shows no underlying abnormality prior to the bleed. Compared with the earlier CT, the hemorrhage is no larger. IMPRESSION: No arterial or venous abnormality is seen to suggest underlying cause for the observed parenchymal, subarachnoid, and intraventricular hemorrhage. Given the history of fall, the observed findings may be posttraumatic in origin. Minor extracranial and intracranial atherosclerotic disease, without flow reducing lesion or dissection. Electronically Signed   By: Elsie StainJohn T Curnes M.D.   On: 04/19/2017 15:21   Ct Head Wo Contrast  Result Date: 04/20/2017 CLINICAL DATA:  83 y/o  F; fall with hemorrhage. EXAM: CT HEAD WITHOUT CONTRAST TECHNIQUE: Contiguous axial images were obtained from the base of the skull through the vertex without intravenous contrast. COMPARISON:  04/19/2017 CT angiogram of head and neck and CT head. FINDINGS: Brain: Stable large pericallosal hematoma, diffuse subarachnoid hemorrhage, left frontal subdural hematoma, and intraventricular hemorrhage. No evidence for new intracranial hemorrhage or large territory stroke. Stable ventricle size. No herniation. Stable background of chronic microvascular ischemic changes and brain parenchymal volume loss. Vascular: Mild calcific atherosclerosis of carotid siphons. Skull: Normal. Negative for fracture or focal lesion. Sinuses/Orbits: No acute finding. Other: None. IMPRESSION: 1. Stable large pericallosal hematoma, subarachnoid hemorrhage, left frontal subdural hematoma, and intraventricular hemorrhage. 2. Stable ventricle size.  No herniation. 3. No new intracranial hemorrhage or brain parenchymal infarction identified. Electronically Signed   By: Mitzi Hansen M.D.   On: 04/20/2017 05:44   Ct Head Wo Contrast  Result Date: 04/19/2017 CLINICAL DATA:  Larey Seat during the night. Altered mental status this morning. Nonverbal. Initial encounter. EXAM: CT HEAD WITHOUT CONTRAST CT CERVICAL SPINE WITHOUT CONTRAST TECHNIQUE: Multidetector CT imaging of the head and cervical spine was performed following the standard protocol without intravenous contrast. Multiplanar CT image reconstructions of the cervical spine were also generated. COMPARISON:  Brain MRI 03/25/2017. Cervical spine radiographs 02/02/2008. FINDINGS: CT HEAD FINDINGS Brain: There is a large volume of acute subarachnoid hemorrhage. A discrete hematoma in the pericallosal region measures 3.7 x 4.7 x 1.8 cm. Subarachnoid hemorrhage is present in multiple sulci along the interhemispheric fissure bilaterally as well as in the predominantly right sylvian fissure. There is intraventricular hemorrhage in the left greater than right lateral ventricles, third ventricle, and fourth ventricle. There is no significant ventricular enlargement compared to the  prior MRI to indicate acute hydrocephalus. A small acute subdural hematoma over left frontal convexity measures 6 mm in thickness. No acute large territory infarct is seen. There is no midline shift. Mild cerebral atrophy is noted. Cerebral white matter hypodensities are nonspecific but compatible with chronic small vessel ischemic disease, mild for age. Vascular: Calcified atherosclerosis at the skullbase. Skull: No fracture or focal osseous lesion. Sinuses/Orbits: Clear paranasal sinuses. Small chronic left mastoid effusion. Prior right mastoidectomy, canal wall up. Bilateral cataract extraction. Other: None. CT CERVICAL SPINE FINDINGS Alignment: Minimal anterolisthesis of C6 on C7 which appears degenerative. Straightening of the normal cervical lordosis. Skull base and vertebrae: No acute fracture or destructive osseous process is identified. Soft tissues and spinal canal: No prevertebral fluid or swelling. Disc levels: Moderate disc degeneration throughout the mid and lower cervical spine. Moderate upper cervical facet arthrosis. Upper chest: Grossly clear lung apices. Other: Subcentimeter low-density right thyroid nodule. Mild calcified atherosclerosis at the carotid bifurcations. IMPRESSION: 1. Large volume acute subarachnoid hemorrhage with a 4.7 cm pericallosal hematoma. Head CTA is recommended to evaluate for a ruptured pericallosal or other distal ACA aneurysm. 2. Intraventricular extension with small volume blood products. No current evidence of hydrocephalus. 3. Small acute left frontal convexity subdural hematoma. 4. No acute fracture in the cervical spine. Critical Value/emergent results were called by telephone at the time of interpretation on 04/19/2017 at 1:49 pm to Dr. Crista Curb , who verbally acknowledged these results. Electronically Signed   By: Sebastian Ache M.D.   On: 04/19/2017 14:00   Ct Angio Neck W And/or Wo Contrast  Result Date: 04/19/2017 CLINICAL DATA:  Continued surveillance  intracranial bleed. Fell during the night. Nonverbal. EXAM: CT ANGIOGRAPHY HEAD AND NECK TECHNIQUE: Multidetector CT imaging of the head and neck was performed using the standard protocol during bolus administration of intravenous contrast. Multiplanar CT image reconstructions and MIPs were obtained to evaluate the vascular anatomy.  Carotid stenosis measurements (when applicable) are obtained utilizing NASCET criteria, using the distal internal carotid diameter as the denominator. CONTRAST:  Isovue 370, 50 mL. COMPARISON:  CT head and C-spine earlier today. FINDINGS: CTA NECK Aortic arch: Standard branching. Imaged portion shows no evidence of aneurysm or dissection. No significant stenosis of the major arch vessel origins. Right carotid system: Minor atheromatous change. No evidence of dissection, stenosis (50% or greater) or occlusion. Left carotid system: Minor atheromatous change. No evidence of dissection, stenosis (50% or greater) or occlusion. Vertebral arteries: LEFT vertebral dominant. No evidence of dissection, stenosis (50% or greater) or occlusion. Nonvascular soft tissues: No lung apex lesion. Developing pulmonary edema. No neck mass. Cervical spondylosis. CTA HEAD Anterior circulation: Minor atheromatous change of the skull base and supraclinoid internal carotid arteries, predominantly calcific. No proximal stenosis of the anterior or middle cerebral arteries. The anterior cerebral arteries are visualized throughout their A2 and A3 segments up to and beyond the large midline intracranial hemorrhage. No aneurysm is seen. There is no evidence for significant vasculitis. No evidence for vascular malformation. Posterior circulation: LEFT vertebral dominant but both contribute to basilar formation. No significant stenosis. No saccular aneurysm. Venous sinuses: As permitted by contrast timing, patent. Anatomic variants: None of significance. Delayed phase:   No abnormal intracranial enhancement. Comparison  with the previous MR from 03/25/2017 shows no underlying abnormality prior to the bleed. Compared with the earlier CT, the hemorrhage is no larger. IMPRESSION: No arterial or venous abnormality is seen to suggest underlying cause for the observed parenchymal, subarachnoid, and intraventricular hemorrhage. Given the history of fall, the observed findings may be posttraumatic in origin. Minor extracranial and intracranial atherosclerotic disease, without flow reducing lesion or dissection. Electronically Signed   By: Elsie Stain M.D.   On: 04/19/2017 15:21   Ct Cervical Spine Wo Contrast  Result Date: 04/19/2017 CLINICAL DATA:  Larey Seat during the night. Altered mental status this morning. Nonverbal. Initial encounter. EXAM: CT HEAD WITHOUT CONTRAST CT CERVICAL SPINE WITHOUT CONTRAST TECHNIQUE: Multidetector CT imaging of the head and cervical spine was performed following the standard protocol without intravenous contrast. Multiplanar CT image reconstructions of the cervical spine were also generated. COMPARISON:  Brain MRI 03/25/2017. Cervical spine radiographs 02/02/2008. FINDINGS: CT HEAD FINDINGS Brain: There is a large volume of acute subarachnoid hemorrhage. A discrete hematoma in the pericallosal region measures 3.7 x 4.7 x 1.8 cm. Subarachnoid hemorrhage is present in multiple sulci along the interhemispheric fissure bilaterally as well as in the predominantly right sylvian fissure. There is intraventricular hemorrhage in the left greater than right lateral ventricles, third ventricle, and fourth ventricle. There is no significant ventricular enlargement compared to the prior MRI to indicate acute hydrocephalus. A small acute subdural hematoma over left frontal convexity measures 6 mm in thickness. No acute large territory infarct is seen. There is no midline shift. Mild cerebral atrophy is noted. Cerebral white matter hypodensities are nonspecific but compatible with chronic small vessel ischemic disease,  mild for age. Vascular: Calcified atherosclerosis at the skullbase. Skull: No fracture or focal osseous lesion. Sinuses/Orbits: Clear paranasal sinuses. Small chronic left mastoid effusion. Prior right mastoidectomy, canal wall up. Bilateral cataract extraction. Other: None. CT CERVICAL SPINE FINDINGS Alignment: Minimal anterolisthesis of C6 on C7 which appears degenerative. Straightening of the normal cervical lordosis. Skull base and vertebrae: No acute fracture or destructive osseous process is identified. Soft tissues and spinal canal: No prevertebral fluid or swelling. Disc levels: Moderate disc degeneration throughout the mid and lower cervical  spine. Moderate upper cervical facet arthrosis. Upper chest: Grossly clear lung apices. Other: Subcentimeter low-density right thyroid nodule. Mild calcified atherosclerosis at the carotid bifurcations. IMPRESSION: 1. Large volume acute subarachnoid hemorrhage with a 4.7 cm pericallosal hematoma. Head CTA is recommended to evaluate for a ruptured pericallosal or other distal ACA aneurysm. 2. Intraventricular extension with small volume blood products. No current evidence of hydrocephalus. 3. Small acute left frontal convexity subdural hematoma. 4. No acute fracture in the cervical spine. Critical Value/emergent results were called by telephone at the time of interpretation on 04/19/2017 at 1:49 pm to Dr. Crista CurbANA LIU , who verbally acknowledged these results. Electronically Signed   By: Sebastian AcheAllen  Grady M.D.   On: 04/19/2017 14:00   Dg Chest Port 1 View  Result Date: 04/19/2017 CLINICAL DATA:  Recent fall with altered mental status, initial encounter EXAM: PORTABLE CHEST 1 VIEW COMPARISON:  None. FINDINGS: Cardiac shadow is mildly prominent but accentuated by the portable technique. Aortic calcifications are seen. The overall inspiratory effort is poor without focal infiltrate. Degenerative changes of the thoracolumbar spine are seen. IMPRESSION: No active disease.  Electronically Signed   By: Alcide CleverMark  Lukens M.D.   On: 04/19/2017 18:32   Dg Abd Portable 1v  Result Date: 04/20/2017 CLINICAL DATA:  Assess feeding tube placement. EXAM: PORTABLE ABDOMEN - 1 VIEW COMPARISON:  None in PACs FINDINGS: The the radiodense tip of the feeding tube projects in the region of the pylorus of the stomach. The bowel gas pattern is within the limits of normal. There is contrast within the urinary bladder. IMPRESSION: The feeding tube tip lies in the region of the pylorus. Electronically Signed   By: David  Swazil[place]Jordan M.D.   On: 04/20/2017 14:38    Assessment/Plan:   LOS: 2 days  Family to discuss comfort care/tx plans today.    Georgiann Cockeroteat, Brian 04/21/2017, 8:18 AM   Patient has not improved neurologically.  Dr. Pearlean BrownieSethi to meet with family this am.  Per ICH scoring, anticipated mortality approaches 97%.

## 2017-04-21 NOTE — Progress Notes (Signed)
Palliative Medicine RN Note: Follow up at request of PMT NP Murrell ConverseSarah Dunn. Referral has not yet been called to [facility]. I called Carley HammedEva w [facility] who will review referral. Family specifically requested [facility] as first choice w Sarah.  Margret ChanceMelanie G. Seaira Byus, RN, BSN, [facility] 04/21/2017 2:45 PM Cell [phone] 8:00-4:00 Monday-Friday Office [phone]

## 2017-04-21 NOTE — Progress Notes (Signed)
Heard in 4N huddle that pt was to be comfort care, family in agreement, but was not likely to pass imminently, so would be transferred to 6N. Stopped by to visit w/ family, but pt already transferred. Chaplain available for f/u.   04/21/17 1200  Clinical Encounter Type  Visited With Patient not available;Health care provider  Visit Type Initial;Psychological support;Spiritual support;Social support  Referral From Chaplain   Ephraim Hamburgerynthia A Addaleigh Nicholls, [address]haplain

## 2017-04-21 NOTE — Progress Notes (Signed)
Name: Theresa Cross MRN: [id number] DOB: 08/17/1925    ADMISSION DATE:  04/19/2017 CONSULTATION DATE:  04/19/2017  REFERRING MD :  Dr. Venetia MaxonStern  CHIEF COMPLAINT:  SAH  HISTORY OF PRESENT ILLNESS:  83 year old female with PMH as below, who presented to [facility] 7/24 after falling out of bed at 1 AM. She presented around 11 AM with chief complaint of confusion and inability to speak. CT of the head demonstrated large volume subarachnoid hemorrhage with intraventricular extension. Also small L frontal SDH. CT angiogram found no clear vascular explanation for hemorrhage. She was admitted by neurosurgery with plans for conservative management. PCCM asked to assist with ICU care.   Of note she has developed dementia, which has progressed to the point she can no longer live alone. Family has made her a DNR  SIGNIFICANT EVENTS  7/24 admit  STUDIES:  CT head 7/24 >Large volume acute subarachnoid hemorrhage with a 4.7 cm pericallosal hematoma. Head CTA is recommended to evaluate for a ruptured pericallosal or other distal ACA aneurysm. 2. Intraventricular extension with small volume blood products. No current evidence of hydrocephalus. 3. Small acute left frontal convexity subdural hematoma. CTA head 7/24 > No arterial or venous abnormality is seen to suggest underlying cause for the observed parenchymal, subarachnoid, and intraventricular hemorrhage. Given the history of fall, the observed findings may be posttraumatic in origin.  Minor extracranial and intracranial atherosclerotic disease, without flow reducing lesion or dissection.  SUBJECTIVE:  No events overnight, patient is becoming more unresponsive  VITAL SIGNS: Temp:  [97.9 F (36.6 C)-98.7 F (37.1 C)] 98.7 F (37.1 C) (07/26 0400) Pulse Rate:  [58-82] 58 (07/26 0800) Resp:  [13-42] 15 (07/26 0800) BP: (99-170)/(39-108) 122/51 (07/26 0800) SpO2:  [96 %-100 %] 98 % (07/26 0800) Weight:  [61.9 kg (136 lb 7.4 oz)] 61.9 kg (136 lb 7.4  oz) (07/26 0318)  PHYSICAL EXAMINATION: General:  Frail elderly female in NAD Neuro:  Responsive only to painful stimuli with moving arms, protecting her airway HEENT:  [place]/AT, PERRL, EOM-I and MMM Cardiovascular:  RRR, Nl S1/S2, -M/R/G. Lungs:  CTA bilaterally Abdomen:  Soft, NT, ND and +BS Musculoskeletal:  No acute deformity Skin:  Grossly intact  Recent Labs Lab 04/19/17 1850 04/20/17 0327 04/21/17 0209  NA 130* 133* 132*  K 3.8 3.5 3.4*  CL 95* 97* 98*  CO2 22 26 25   BUN 10 12 17   CREATININE 0.64 0.65 0.52  GLUCOSE 136* 146* 129*    Recent Labs Lab 04/19/17 1850 04/20/17 0327 04/21/17 0209  HGB 13.8 13.0 11.9*  HCT 40.2 38.8 35.8*  WBC 12.6* 14.1* 16.6*  PLT 270 296 271   Ct Angio Head W Or Wo Contrast  Result Date: 04/19/2017 CLINICAL DATA:  Continued surveillance intracranial bleed. Fell during the night. Nonverbal. EXAM: CT ANGIOGRAPHY HEAD AND NECK TECHNIQUE: Multidetector CT imaging of the head and neck was performed using the standard protocol during bolus administration of intravenous contrast. Multiplanar CT image reconstructions and MIPs were obtained to evaluate the vascular anatomy. Carotid stenosis measurements (when applicable) are obtained utilizing NASCET criteria, using the distal internal carotid diameter as the denominator. CONTRAST:  Isovue 370, 50 mL. COMPARISON:  CT head and C-spine earlier today. FINDINGS: CTA NECK Aortic arch: Standard branching. Imaged portion shows no evidence of aneurysm or dissection. No significant stenosis of the major arch vessel origins. Right carotid system: Minor atheromatous change. No evidence of dissection, stenosis (50% or greater) or occlusion. Left carotid system:  Minor atheromatous change. No evidence of dissection, stenosis (50% or greater) or occlusion. Vertebral arteries: LEFT vertebral dominant. No evidence of dissection, stenosis (50% or greater) or occlusion. Nonvascular soft tissues: No lung apex lesion.  Developing pulmonary edema. No neck mass. Cervical spondylosis. CTA HEAD Anterior circulation: Minor atheromatous change of the skull base and supraclinoid internal carotid arteries, predominantly calcific. No proximal stenosis of the anterior or middle cerebral arteries. The anterior cerebral arteries are visualized throughout their A2 and A3 segments up to and beyond the large midline intracranial hemorrhage. No aneurysm is seen. There is no evidence for significant vasculitis. No evidence for vascular malformation. Posterior circulation: LEFT vertebral dominant but both contribute to basilar formation. No significant stenosis. No saccular aneurysm. Venous sinuses: As permitted by contrast timing, patent. Anatomic variants: None of significance. Delayed phase:   No abnormal intracranial enhancement. Comparison with the previous MR from 03/25/2017 shows no underlying abnormality prior to the bleed. Compared with the earlier CT, the hemorrhage is no larger. IMPRESSION: No arterial or venous abnormality is seen to suggest underlying cause for the observed parenchymal, subarachnoid, and intraventricular hemorrhage. Given the history of fall, the observed findings may be posttraumatic in origin. Minor extracranial and intracranial atherosclerotic disease, without flow reducing lesion or dissection. Electronically Signed   By: Elsie StainJohn T Curnes M.D.   On: 04/19/2017 15:21   Ct Head Wo Contrast  Result Date: 04/20/2017 CLINICAL DATA:  83 y/o  F; fall with hemorrhage. EXAM: CT HEAD WITHOUT CONTRAST TECHNIQUE: Contiguous axial images were obtained from the base of the skull through the vertex without intravenous contrast. COMPARISON:  04/19/2017 CT angiogram of head and neck and CT head. FINDINGS: Brain: Stable large pericallosal hematoma, diffuse subarachnoid hemorrhage, left frontal subdural hematoma, and intraventricular hemorrhage. No evidence for new intracranial hemorrhage or large territory stroke. Stable ventricle  size. No herniation. Stable background of chronic microvascular ischemic changes and brain parenchymal volume loss. Vascular: Mild calcific atherosclerosis of carotid siphons. Skull: Normal. Negative for fracture or focal lesion. Sinuses/Orbits: No acute finding. Other: None. IMPRESSION: 1. Stable large pericallosal hematoma, subarachnoid hemorrhage, left frontal subdural hematoma, and intraventricular hemorrhage. 2. Stable ventricle size.  No herniation. 3. No new intracranial hemorrhage or brain parenchymal infarction identified. Electronically Signed   By: Mitzi HansenLance  Furusawa-Stratton M.D.   On: 04/20/2017 05:44   Ct Head Wo Contrast  Result Date: 04/19/2017 CLINICAL DATA:  Larey SeatFell during the night. Altered mental status this morning. Nonverbal. Initial encounter. EXAM: CT HEAD WITHOUT CONTRAST CT CERVICAL SPINE WITHOUT CONTRAST TECHNIQUE: Multidetector CT imaging of the head and cervical spine was performed following the standard protocol without intravenous contrast. Multiplanar CT image reconstructions of the cervical spine were also generated. COMPARISON:  Brain MRI 03/25/2017. Cervical spine radiographs 02/02/2008. FINDINGS: CT HEAD FINDINGS Brain: There is a large volume of acute subarachnoid hemorrhage. A discrete hematoma in the pericallosal region measures 3.7 x 4.7 x 1.8 cm. Subarachnoid hemorrhage is present in multiple sulci along the interhemispheric fissure bilaterally as well as in the predominantly right sylvian fissure. There is intraventricular hemorrhage in the left greater than right lateral ventricles, third ventricle, and fourth ventricle. There is no significant ventricular enlargement compared to the prior MRI to indicate acute hydrocephalus. A small acute subdural hematoma over left frontal convexity measures 6 mm in thickness. No acute large territory infarct is seen. There is no midline shift. Mild cerebral atrophy is noted. Cerebral white matter hypodensities are nonspecific but  compatible with chronic small vessel ischemic disease, mild for  age. Vascular: Calcified atherosclerosis at the skullbase. Skull: No fracture or focal osseous lesion. Sinuses/Orbits: Clear paranasal sinuses. Small chronic left mastoid effusion. Prior right mastoidectomy, canal wall up. Bilateral cataract extraction. Other: None. CT CERVICAL SPINE FINDINGS Alignment: Minimal anterolisthesis of C6 on C7 which appears degenerative. Straightening of the normal cervical lordosis. Skull base and vertebrae: No acute fracture or destructive osseous process is identified. Soft tissues and spinal canal: No prevertebral fluid or swelling. Disc levels: Moderate disc degeneration throughout the mid and lower cervical spine. Moderate upper cervical facet arthrosis. Upper chest: Grossly clear lung apices. Other: Subcentimeter low-density right thyroid nodule. Mild calcified atherosclerosis at the carotid bifurcations. IMPRESSION: 1. Large volume acute subarachnoid hemorrhage with a 4.7 cm pericallosal hematoma. Head CTA is recommended to evaluate for a ruptured pericallosal or other distal ACA aneurysm. 2. Intraventricular extension with small volume blood products. No current evidence of hydrocephalus. 3. Small acute left frontal convexity subdural hematoma. 4. No acute fracture in the cervical spine. Critical Value/emergent results were called by telephone at the time of interpretation on 04/19/2017 at 1:49 pm to Dr. Crista Curb , who verbally acknowledged these results. Electronically Signed   By: Sebastian Ache M.D.   On: 04/19/2017 14:00   Ct Angio Neck W And/or Wo Contrast  Result Date: 04/19/2017 CLINICAL DATA:  Continued surveillance intracranial bleed. Fell during the night. Nonverbal. EXAM: CT ANGIOGRAPHY HEAD AND NECK TECHNIQUE: Multidetector CT imaging of the head and neck was performed using the standard protocol during bolus administration of intravenous contrast. Multiplanar CT image reconstructions and MIPs were  obtained to evaluate the vascular anatomy. Carotid stenosis measurements (when applicable) are obtained utilizing NASCET criteria, using the distal internal carotid diameter as the denominator. CONTRAST:  Isovue 370, 50 mL. COMPARISON:  CT head and C-spine earlier today. FINDINGS: CTA NECK Aortic arch: Standard branching. Imaged portion shows no evidence of aneurysm or dissection. No significant stenosis of the major arch vessel origins. Right carotid system: Minor atheromatous change. No evidence of dissection, stenosis (50% or greater) or occlusion. Left carotid system: Minor atheromatous change. No evidence of dissection, stenosis (50% or greater) or occlusion. Vertebral arteries: LEFT vertebral dominant. No evidence of dissection, stenosis (50% or greater) or occlusion. Nonvascular soft tissues: No lung apex lesion. Developing pulmonary edema. No neck mass. Cervical spondylosis. CTA HEAD Anterior circulation: Minor atheromatous change of the skull base and supraclinoid internal carotid arteries, predominantly calcific. No proximal stenosis of the anterior or middle cerebral arteries. The anterior cerebral arteries are visualized throughout their A2 and A3 segments up to and beyond the large midline intracranial hemorrhage. No aneurysm is seen. There is no evidence for significant vasculitis. No evidence for vascular malformation. Posterior circulation: LEFT vertebral dominant but both contribute to basilar formation. No significant stenosis. No saccular aneurysm. Venous sinuses: As permitted by contrast timing, patent. Anatomic variants: None of significance. Delayed phase:   No abnormal intracranial enhancement. Comparison with the previous MR from 03/25/2017 shows no underlying abnormality prior to the bleed. Compared with the earlier CT, the hemorrhage is no larger. IMPRESSION: No arterial or venous abnormality is seen to suggest underlying cause for the observed parenchymal, subarachnoid, and  intraventricular hemorrhage. Given the history of fall, the observed findings may be posttraumatic in origin. Minor extracranial and intracranial atherosclerotic disease, without flow reducing lesion or dissection. Electronically Signed   By: Elsie Stain M.D.   On: 04/19/2017 15:21   Ct Cervical Spine Wo Contrast  Result Date: 04/19/2017 CLINICAL DATA:  Larey Seat  during the night. Altered mental status this morning. Nonverbal. Initial encounter. EXAM: CT HEAD WITHOUT CONTRAST CT CERVICAL SPINE WITHOUT CONTRAST TECHNIQUE: Multidetector CT imaging of the head and cervical spine was performed following the standard protocol without intravenous contrast. Multiplanar CT image reconstructions of the cervical spine were also generated. COMPARISON:  Brain MRI 03/25/2017. Cervical spine radiographs 02/02/2008. FINDINGS: CT HEAD FINDINGS Brain: There is a large volume of acute subarachnoid hemorrhage. A discrete hematoma in the pericallosal region measures 3.7 x 4.7 x 1.8 cm. Subarachnoid hemorrhage is present in multiple sulci along the interhemispheric fissure bilaterally as well as in the predominantly right sylvian fissure. There is intraventricular hemorrhage in the left greater than right lateral ventricles, third ventricle, and fourth ventricle. There is no significant ventricular enlargement compared to the prior MRI to indicate acute hydrocephalus. A small acute subdural hematoma over left frontal convexity measures 6 mm in thickness. No acute large territory infarct is seen. There is no midline shift. Mild cerebral atrophy is noted. Cerebral white matter hypodensities are nonspecific but compatible with chronic small vessel ischemic disease, mild for age. Vascular: Calcified atherosclerosis at the skullbase. Skull: No fracture or focal osseous lesion. Sinuses/Orbits: Clear paranasal sinuses. Small chronic left mastoid effusion. Prior right mastoidectomy, canal wall up. Bilateral cataract extraction. Other: None. CT  CERVICAL SPINE FINDINGS Alignment: Minimal anterolisthesis of C6 on C7 which appears degenerative. Straightening of the normal cervical lordosis. Skull base and vertebrae: No acute fracture or destructive osseous process is identified. Soft tissues and spinal canal: No prevertebral fluid or swelling. Disc levels: Moderate disc degeneration throughout the mid and lower cervical spine. Moderate upper cervical facet arthrosis. Upper chest: Grossly clear lung apices. Other: Subcentimeter low-density right thyroid nodule. Mild calcified atherosclerosis at the carotid bifurcations. IMPRESSION: 1. Large volume acute subarachnoid hemorrhage with a 4.7 cm pericallosal hematoma. Head CTA is recommended to evaluate for a ruptured pericallosal or other distal ACA aneurysm. 2. Intraventricular extension with small volume blood products. No current evidence of hydrocephalus. 3. Small acute left frontal convexity subdural hematoma. 4. No acute fracture in the cervical spine. Critical Value/emergent results were called by telephone at the time of interpretation on 04/19/2017 at 1:49 pm to Dr. Crista Curb , who verbally acknowledged these results. Electronically Signed   By: Sebastian Ache M.D.   On: 04/19/2017 14:00   Dg Chest Port 1 View  Result Date: 04/19/2017 CLINICAL DATA:  Recent fall with altered mental status, initial encounter EXAM: PORTABLE CHEST 1 VIEW COMPARISON:  None. FINDINGS: Cardiac shadow is mildly prominent but accentuated by the portable technique. Aortic calcifications are seen. The overall inspiratory effort is poor without focal infiltrate. Degenerative changes of the thoracolumbar spine are seen. IMPRESSION: No active disease. Electronically Signed   By: Alcide Clever M.D.   On: 04/19/2017 18:32   Dg Abd Portable 1v  Result Date: 04/20/2017 CLINICAL DATA:  Assess feeding tube placement. EXAM: PORTABLE ABDOMEN - 1 VIEW COMPARISON:  None in PACs FINDINGS: The the radiodense tip of the feeding tube projects in  the region of the pylorus of the stomach. The bowel gas pattern is within the limits of normal. There is contrast within the urinary bladder. IMPRESSION: The feeding tube tip lies in the region of the pylorus. Electronically Signed   By: David  [place] M.D.   On: 04/20/2017 14:38   I reviewed head CT myself, no change in size of bleed  ASSESSMENT / PLAN:  83 year old female with traumatic ICH, SAH and SDH  that is completely unresponsive.  Patient has made her wishes clear that she does not wish for aggressive measures.  I spoke with 2 sons and one daughter today at length.  After discussion, will proceed with comfort care, remove NGT, d/c all medications except for comfort and transfer to palliative care floor.  PCCM will sign off, please call back if needed.  The patient is critically ill with multiple organ systems failure and requires high complexity decision making for assessment and support, frequent evaluation and titration of therapies, application of advanced monitoring technologies and extensive interpretation of multiple databases.   Critical Care Time devoted to patient care services described in this note is  33  Minutes. This time reflects time of care of this signee Dr Koren Bound. This critical care time does not reflect procedure time, or teaching time or supervisory time of PA/NP/Med student/Med Resident etc but could involve care discussion time.  Alyson Reedy, M.D. [facility] Pulmonary/Critical Care Medicine. Pager: [phone]. After hours pager: [phone].  04/21/2017 9:06 AM

## 2017-04-21 NOTE — Care Management Note (Signed)
Case Management Note  Patient Details  Name: Theresa Cross MRN: [id number] Date of Birth: 03/13/1925  Subjective/Objective: Pt admitted on 04/19/17 with non-aneurysmal SAH, ICH, IVH, likely after falling out of bed.  PTA, pt resided at home with daughter.                     Action/Plan: Pt with poor prognosis; palliative care team following.  Family has made pt DNR, and [facility].  CSW consulted to facilitate dc to Hospice facility upon bed availability.    Expected Discharge Date:                  Expected Discharge Plan:  Hospice Medical Facility  In-House Referral:  Clinical Social Work  Discharge planning Services  CM Consult  Post Acute Care Choice:    Choice offered to:     DME Arranged:    DME Agency:     HH Arranged:    HH Agency:     Status of Service:  In process, will continue to follow  If discussed at Long Length of Stay Meetings, dates discussed:    Additional Comments:  Quintella BatonJulie W. Dontez Hauss, RN, BSN  Trauma/Neuro ICU Case Manager [phone]

## 2017-04-21 NOTE — Progress Notes (Signed)
PT Cancellation Note  Patient Details Name: Theresa Cross MRN: [id number] DOB: 09/04/1925   Cancelled Treatment:    Reason Eval/Treat Not Completed: Patient not medically ready. Pt currently on bedrest. Noted GOC meeting planned for this morning. Will continue to follow for medical readiness and appropriateness for PT eval.   Marylynn PearsonLaura D Leida Luton 04/21/2017, 7:34 AM   Conni SlipperLaura Charles Andringa, PT, DPT Acute Rehabilitation Services Pager: [phone]

## 2017-04-21 NOTE — Progress Notes (Signed)
STROKE TEAM PROGRESS NOTE   HISTORY OF PRESENT ILLNESS (per record) 83 year old female brought in by EMS for altered mental status. History of dementia, arthritis and osteoporosis. No blood thinners. Fall out of bed at 1 AM on 7.25.2018 with resultant traumatic paracollosal SDH and traumatic SAH with extension of blood across falx into left ventricle.  EMS was called and felt she was well on scene.  At 11 AM family noted she was confused, non-verbal. At baseline very functional. Patient has been progressively developing dementia and can no longer live alone.  She has been made a DNR by her family.  Neurosurgery has evaluated, and patient is not a neurosurgical candidate.  SBP goal < 140mmHg for SAH.  Family meeting at 0900 on 04/21/2017 to discuss goals of care.    Patient was not administered IV t-PA secondary to ICH. She was admitted to the neuro ICU for further evaluation and treatment.   SUBJECTIVE (INTERVAL HISTORY) Her son is I  at the bedside.  The patient is drowsy, but opens eyes to noxious stimuli.She appears comfortable   OBJECTIVE Temp:  [98.5 F (36.9 C)-99.3 F (37.4 C)] 99.3 F (37.4 C) (07/26 0800) Pulse Rate:  [58-82] 67 (07/26 1144) Cardiac Rhythm: Normal sinus rhythm (07/26 0400) Resp:  [13-42] 15 (07/26 1144) BP: (99-170)/(42-108) 158/60 (07/26 1144) SpO2:  [96 %-100 %] 97 % (07/26 1144) Weight:  [136 lb 7.4 oz (61.9 kg)] 136 lb 7.4 oz (61.9 kg) (07/26 0318)  CBC:  Recent Labs Lab 04/19/17 1245  04/20/17 0327 04/21/17 0209  WBC 10.5  < > 14.1* 16.6*  NEUTROABS 8.2*  --   --   --   HGB 13.0  < > 13.0 11.9*  HCT 38.5  < > 38.8 35.8*  MCV 84.4  < > 84.2 85.0  PLT 269  < > 296 271  < > = values in this interval not displayed.  Basic Metabolic Panel:   Recent Labs Lab 04/20/17 0327 04/21/17 0209  NA 133* 132*  K 3.5 3.4*  CL 97* 98*  CO2 26 25  GLUCOSE 146* 129*  BUN 12 17  CREATININE 0.65 0.52  CALCIUM 9.2 8.8*  MG  --  2.0  PHOS  --  2.7     Lipid Panel:     Component Value Date/Time   TRIG 68 04/21/2017 0209   HgbA1c: No results found for: HGBA1C Urine Drug Screen: No results found for: LABOPIA, COCAINSCRNUR, LABBENZ, AMPHETMU, THCU, LABBARB  Alcohol Level No results found for: ETH  IMAGING  Ct Angio Head W Or Wo Contrast Ct Angio Neck W And/or Wo Contrast 04/19/2017 IMPRESSION: No arterial or venous abnormality is seen to suggest underlying cause for the observed parenchymal, subarachnoid, and intraventricular hemorrhage. Given the history of fall, the observed findings may be posttraumatic in origin. Minor extracranial and intracranial atherosclerotic disease, without flow reducing lesion or dissection.  Ct Head Wo Contrast 04/20/2017 IMPRESSION: 1. Stable large pericallosal hematoma, subarachnoid hemorrhage, left frontal subdural hematoma, and intraventricular hemorrhage. 2. Stable ventricle size.  No herniation. 3. No new intracranial hemorrhage or brain parenchymal infarction identified.   Ct Head Wo Contrast 04/19/2017 IMPRESSION: 1. Large volume acute subarachnoid hemorrhage with a 4.7 cm pericallosal hematoma. Head CTA is recommended to evaluate for a ruptured pericallosal or other distal ACA aneurysm. 2. Intraventricular extension with small volume blood products. No current evidence of hydrocephalus. 3. Small acute left frontal convexity subdural hematoma. 4. No acute fracture in the cervical spine.  Ct Cervical Spine Wo Contrast  04/19/2017 IMPRESSION: 1. Large volume acute subarachnoid hemorrhage with a 4.7 cm pericallosal hematoma. Head CTA is recommended to evaluate for a ruptured pericallosal or other distal ACA aneurysm. 2. Intraventricular extension with small volume blood products. No current evidence of hydrocephalus. 3. Small acute left frontal convexity subdural hematoma. 4. No acute fracture in the cervical spine.   Dg Chest Port 1 View 04/19/2017 IMPRESSION: No active disease.  Dg Abd Portable  1v 04/20/2017 IMPRESSION: The feeding tube tip lies in the region of the pylorus.       PHYSICAL EXAM Frail  elderly lady not in distress. . Afebrile. Head is nontraumatic. Neck is supple without bruit.    Cardiac exam no murmur or gallop. Lungs are clear to auscultation. Distal pulses are well felt.  Neurological Exam :   Drowsy but can open eyes when stimulated. Pupils small 2 mm reactive. Doll's  She mutters few incomprehensible words but does not follow any commands Mild left lower facial weakness. Mild left hemiparesis but did with draw slightly on left to painful stimuli and briskly on right side  to painful stimuli. Deep tendon reflexes are symmetric. Both Plantars are withdrawal response. Gait deferred ASSESSMENT/PLAN Ms. Theresa Cross is a 83 y.o. female with history of dementia, arthritis and osteoporosis presenting with traumatic SDH and traumatic SAH secondary to mechanical fall. She did not receive IV t-PA due to ICH.   Stroke: Traumatic paricollosal SDH and traumatic SAH with parafalcine extension and left IVH secondary to mechanical fall.  Resultant aphasia and left hemiparesis.  CT head: Stable large pericallosal hematoma, subarachnoid hemorrhage, left frontal subdural hematoma, and intraventricular hemorrhage.  CTA head/neck: Unremarkable  SCDs for VTE prophylaxis Diet NPO time specified  No antithrombotic prior to admission, now on No antithrombotic  Therapy recommendations: none  Disposition:   ending  Hypertension  Stable  Goal SBP < 140 mmHg for SAH  Unsuccessful wean from clevidipine gtt today  Long-term BP goal normotensive  SAH   HH3F2  No ICP monitoring  Goal SBP < 140  TCDs not ordered; comfort care likely  Nimodipine discontinued; comfort care likely  Keppra 500mg  IV BID for seizure prophylaxis  Family meeting at 0900 tomorrow to discuss goals of care  Son has indicated he wants comfort care, but wants other family present for  discussion first   Other Active Problems  None  Hospital day # 2   The patient's prognosis is poor given intracerebral hemorrhage, cytotoxic edema and intraventricular extension. She is unlikely to survive without prolonged ventilatory support, feeding tube and nursing home care. Patient's  son is realistic and has decided on palliative and comfort care.. I am in agreement. Palliative care team has been consulted to help family. Stroke team will sign off at the present time. Kindly call for questions. This patient is critically ill and at significant risk of neurological worsening, death and care requires constant monitoring of vital signs, hemodynamics,respiratory and cardiac monitoring, extensive review of multiple databases, frequent neurological assessment, discussion with family, other specialists and medical decision making of high complexity.I have made any additions or clarifications directly to the above note.This critical care time does not reflect procedure time, or teaching time or supervisory time of PA/NP/Med Resident etc but could involve care discussion time.  I spent 30 minutes of neurocritical care time  in the care of  this patient.     Theresa HeadyPramod Selig Wampole, MD Medical Director [facility] Pager: [phone] 04/21/2017  1:06 PM   To contact Stroke Continuity provider, please refer to [email or web address]. After hours, contact General Neurology

## 2017-04-21 NOTE — Progress Notes (Signed)
Pt transferred to 6N from 4N b/c her passing didn't seem imminent, and now she'll likely go to residential hospice. Prayed w/ pt and daughter Darl PikesSusan, who had been able to understand that her mom was asking who I was, for me to move closer to see me. Pt seemed conscious of (if barely) and comforted by the ([place]) prayer (she seemed soothed and was calmer afterward). Darl PikesSusan said her [facility] went [facility].  Provided spiritual/emotional/grief support to daughter, who was telling me of her mom's this week's health events when telemetry started signaling and I went to find pt's nurse. The [facility] representative was also then waiting to see Darl PikesSusan. Will f/u w/ her tomorrow.   04/21/17 1600  Clinical Encounter Type  Visited With Patient and family together;Health care provider  Visit Type Follow-up;Psychological support;Spiritual support;Social support;Critical Care;Other (Comment) (pt transitioned to comfort care)  Referral From Nurse  Spiritual Encounters  Spiritual Needs Prayer;Emotional;Grief support  Stress Factors  Patient Stress Factors Other (Comment) (dying)  Family Stress Factors Loss   Ephraim Hamburgerynthia A Keynan Heffern, [address]

## 2017-04-21 NOTE — Consult Note (Addendum)
[place] Liaison Received request from [facility] with Palliative Medicine Team for family interest in [facility]. Confirmed with CSW Britney. Chart reviewed and met briefly with daughter Manuela Schwartz at bedside. Manuela Schwartz deferred me to Ronalee Belts or [place]. Attempted phone call to [facility] at number listed on face sheet. Left message requesting call back. Did not see Ronalee Belts listed anywhere. Sent message to [place] letting her know no availability today. Hospital team will follow up tomorrow re availability.  Addendum - 2000 - Received call back from patient's son Louie Casa who lives in [place], [place]. He is currently in [place]. Answered his questions related to [facility]. Louie Casa is aware [facility] does not have a room available at this time. He provided me with his brother Mike's phone number. Per Tamera Stands is POA and executor of estate. Mike's number is [phone]. Ronalee Belts is currently in [place]. Louie Casa is aware a hospital team member will follow up with CSW or family tomorrow.     Thank you,  Erling Conte, LCSW [phone]

## 2017-04-21 NOTE — Progress Notes (Signed)
OT Cancellation Note  Patient Details Name: Theresa Cross MRN: [id number] DOB: 08/27/1925   Cancelled Treatment:    Reason Eval/Treat Not Completed: Other (comment) (pt transitioning to palliative care; not appropriate for OT). Will sign off from OT standpoint at this time. Thank you for this referral.  Gaye AlkenBailey A Evamarie Raetz M.S., OTR/L Pager: [phone]  04/21/2017, 10:53 AM

## 2017-04-21 NOTE — Consult Note (Signed)
Consultation Note Date: 04/21/2017   Patient Name: Theresa Cross  DOB: [birth year]  MRN: [id number]  Age / Sex: 83 y.o., female  PCP: Prince Solian, MD Referring Physician: Erline Levine, MD  Reason for Consultation: Disposition, Establishing goals of care and Psychosocial/spiritual support  HPI/Patient Profile: 83 y.o. female  with past medical history of dementia, HOH (requires hearing aids), osteoporosis, and arthritis. She presented to the ED after being found confused and non-verbal in the late morning. Earlier in the morning she had fallen out of bed, for which EMS was called and felt she was fine. On arrival to the ED she was alert but non-verbal and not following commands. She was admitted on 04/19/2017. Imaging revealed a non-aneurysmal SAH, ICH, and IVH. She was not a neurosurgical candidate. Prognosis poor per Neurology. Family clarified her DNR status. Plan for repeat family meeting on 7/26 with Neurology, with Palliative asked to follow-up after this meeting.   Clinical Assessment and Goals of Care: I met the patient's son at her bedside. He relates that more family is arriving, however the decision was made to proceed with full comfort measures and transition away from aggressive and life prolonging care. We talked through this decision, and he shared the conversations that he had with his mother, as well as the stories that guided her opinion on her own care. He was very tearful but appreciative that she had given him direction on her wishes.   We then talked through the transition to comfort care. I explained the focus on her, rather than labs/tests/numbers. I shared my expectation that her vitals would change as her body starts to shut down; this is expected, normal, and it doesn't influence how we take care to ensure her comfort. He did feel comfortable with taking her off of telemetry. He was also aware of the plan to remove the NG tube.  Finally, we did talk about location for her care. As she is no longer eating or drinking and is minimally responsive, I recommended transitioning to a residential hospice facility. He was aware of Hospice as his father benefited from their services. After hearing about the type of care they provide, the specialization surrounding end of life, and the differences between Hospice facilities and the hospital, he did agree it would be a better location for care. He preferred a provider in [place].  Primary Decision Maker NEXT OF KIN   SUMMARY OF RECOMMENDATIONS    DNR, full comfort care, SW consult placed for [facility] referral  Orders adjusted for comfort, to include removing telemetry and removing NG tube  Would leave NS running at [facility] in an attempt to keep patent IV access for ease of Palliative medication administration  Code Status/Advance Care Planning:  DNR   Symptom Management:  Mid pain or fever: Tylenol PR Moderate pain or dyspnea: Morphine IV Anxiety: Haldol IV Itching: Benadryl IV Bowels/GI: Dulcolax PR, Protonix IV Nausea: Zofran IV Secretions: Robinul IV Seizure prophylaxis: Keppra IV  Palliative Prophylaxis:   Bowel Regimen, Frequent Pain Assessment, Oral Care and Turn Reposition  Additional Recommendations (Limitations, Scope, Preferences):  Full Comfort Care  Psycho-social/Spiritual:   Desire for further Chaplaincy support:yes  Additional Recommendations: Education on Hospice  Prognosis:   < 2 weeks. Pt with significant non-aneurysmal SAH, ICH, and IVH. She is not eating or drinking and predominantly asleep.  Discharge Planning: Hospice facility      Primary Diagnoses: Present on Admission: . Subarachnoid hemorrhage following injury ([place])   I have reviewed the  medical record, interviewed the patient and family, and examined the patient. The following aspects are pertinent.  Past Medical History:  Diagnosis Date  . Allergy   . Arthritis     . Osteoporosis    Social History   Social History  . Marital status: Widowed    Spouse name: N/A  . Number of children: 3  . Years of education: N/A   Social History Main Topics  . Smoking status: Never Smoker  . Smokeless tobacco: Never Used  . Alcohol use No  . Drug use: No  . Sexual activity: Not Asked   Other Topics Concern  . None   Social History Narrative   Lives at home, daughter stays w/ her   Right-handed   Caffeine: instant coffee throughout the day   Family History  Problem Relation Age of Onset  . Cancer Mother   . Heart disease Mother   . Cancer Brother   . Cancer Brother    Scheduled Meds: . mouth rinse  15 mL Mouth Rinse BID  . pantoprazole (PROTONIX) IV  40 mg Intravenous QHS   Continuous Infusions: . sodium chloride 30 mL/hr at 04/21/17 1053  . levETIRAcetam Stopped (04/21/17 0620)   PRN Meds:.[DISCONTINUED] acetaminophen **OR** [DISCONTINUED] acetaminophen (TYLENOL) oral liquid 160 mg/5 mL **OR** acetaminophen, antiseptic oral rinse, bisacodyl, diphenhydrAMINE, glycopyrrolate, haloperidol lactate, labetalol, morphine injection, ondansetron (ZOFRAN) IV, polyvinyl alcohol Allergies  Allergen Reactions  . Amoxicillin Itching  . Codeine Itching  . Gabapentin Itching  . Morphine Itching   Review of Systems  Unable to perform ROS  Physical Exam  Constitutional: She appears well-developed and well-nourished. No distress.  HENT:  Asleep, mouth closed and pt resists opening it  Cardiovascular: Normal rate and regular rhythm.   Pulmonary/Chest: Effort normal. She has rhonchi (scattered).  Abdominal: Soft. Bowel sounds are normal.  Musculoskeletal:  UTA, pt asleep and very difficult to wake. No movements of extremities noted, nor could any be elicited   Neurological:  Asleep. Eyes open briefly with very loud voice, but quickly falls back asleep. Not verbally responding nor was she awake enough to follow commands  Skin: Skin is warm and dry.  Bruising noted. There is pallor.  Psychiatric:  Appears calm resting in bed    Vital Signs: BP (!) 125/50   Pulse (!) 59   Temp 99.3 F (37.4 C) (Axillary)   Resp 15   Ht '5\' 3"'$  (1.6 m)   Wt 61.9 kg (136 lb 7.4 oz)   SpO2 100%   BMI 24.17 kg/m  Pain Assessment: CPOT     SpO2: SpO2: 100 % O2 Device:SpO2: 100 % O2 Flow Rate: .O2 Flow Rate (L/min): 2 L/min  IO: Intake/output summary:   Intake/Output Summary (Last 24 hours) at 04/21/17 1114 Last data filed at 04/21/17 0800  Gross per 24 hour  Intake             1552 ml  Output              875 ml  Net              677 ml    LBM: Last BM Date: 04/20/17 Baseline Weight: Weight: 60.3 kg (132 lb 15 oz) Most recent weight: Weight: 61.9 kg (136 lb 7.4 oz)     Palliative Assessment/Data: PPS 10%   Flowsheet Rows     Most Recent Value  Intake Tab  Referral Department  Neurology  Unit at Time of Referral  ICU  Palliative Care  Primary Diagnosis  Neurology  Date Notified  04/20/17  Palliative Care Type  New Palliative care  Reason for referral  Clarify Goals of Care  Date of Admission  04/19/17  # of days IP prior to Palliative referral  1  Clinical Assessment  Psychosocial & Spiritual Assessment  Palliative Care Outcomes     Time Total: 35 minutes Greater than 50%  of this time was spent counseling and coordinating care related to the above assessment and plan.  Signed by: Charlynn Court, NP Palliative Medicine Team Pager # [phone] (M-F 7a-5p) Team Phone # [phone] (Nights/Weekends)

## 2017-04-22 DIAGNOSIS — Z515 Encounter for palliative care: Secondary | ICD-10-CM

## 2017-04-22 NOTE — Progress Notes (Signed)
Stopped by to visit w/ pt and family. Pt may have been initially aware I was there --her eyes fluttered, she squeezed my hand, and her hand was warm. Son Harvie HeckRandy said she was resting comfortably. She then seemed to be sleeping peacefully. Provided spiritual/emotional/ grief support, ministry of touch/presence and prayer -- in which he joined in praying the Lord's prayer at the end. Advised him I'd told his sister Darl PikesSusan yesterday that they could ask a nurse to page for a chaplain at any time.   04/22/17 1500  Clinical Encounter Type  Visited With Patient and family together  Visit Type Follow-up;Psychological support;Spiritual support;Social support;Patient actively dying  Referral From Chaplain  Spiritual Encounters  Spiritual Needs Prayer;Emotional;Grief support  Stress Factors  Family Stress Factors Loss  Theresa Cross, [address]

## 2017-04-22 NOTE — Discharge Summary (Signed)
Physician Discharge Summary  Patient ID: Theresa Cross MRN: [id number] DOB/AGE: 81/12/1924 81 y.o.  Admit date: 04/19/2017 Discharge date: 04/22/2017  Admission Diagnoses: non-aneurysmal SAH due to fall  Discharge Diagnoses: non-aneurysmal SAH due to fall, comfort care Active Problems:   Subarachnoid hemorrhage following injury (HCC)   Hypokalemia   Malignant hypertension   Acute encephalopathy   Palliative care by specialist   Terminal care   Discharged Condition: stable  Hospital Course: Theresa SouFlora P Amble is an 83 y.o. female brought in by EMS for altered mental status. History of arthritis and osteoporosis. No blood thinners. Fall out of bed during the night and found in am to be confused, non-verbal. At baseline very functional. Patient has been progressively developing dementia and can no longer live alone.  She has been made a DNR by her family. Work-up revealed non-aneurysmal SAH, ICH, IVH.  Follow-up scan revealed no progression of bleed. Observation with comfort care recommended.     Consults: CCM, Palliative Care  Significant Diagnostic Studies: CT  Treatments:   Discharge Exam: Blood pressure (!) 158/60, pulse 67, temperature 99.3 F (37.4 C), temperature source Axillary, resp. rate 15, height 5\' 3"  (1.6 m), weight 61.9 kg (136 lb 7.4 oz), SpO2 97 %. Awake now for only the second time today per her son, she appears to be resting quietly.  She grasps my hand and shakes her head "no" when asked about pain.  Son and other family members have agreed to pursue comfort care. She has been offered a room at [facility].      Disposition: Discharge to Hospice/[facility] for comfort care.   Discharge Instructions    Diet - low sodium heart healthy    Complete by:  As directed    Increase activity slowly    Complete by:  As directed       Follow-up Information    [facility].   Specialty:  Emergency Medicine Why:  If symptoms  worsen Contact information: [address] [id number] [place] [id number] [phone]          Signed: Georgiann Cockeroteat, Byrd Terrero 04/22/2017, 2:20 PM

## 2017-04-22 NOTE — Care Management Note (Signed)
Case Management Note  Patient Details  Name: Theresa Cross MRN: [id number] Date of Birth: 07/31/1925  Subjective/Objective: Pt admitted on 04/19/17 with non-aneurysmal SAH, ICH, IVH, likely after falling out of bed.  PTA, pt resided at home with daughter.                     Action/Plan: Pt with poor prognosis; palliative care team following.  Family has made pt DNR, and they prefer [facility] for disposition.  CSW consulted to facilitate dc to Hospice facility upon bed availability.    Expected Discharge Date:  04/22/17               Expected Discharge Plan:  Hospice Medical Facility  In-House Referral:  Clinical Social Work  Discharge planning Services  CM Consult  Post Acute Care Choice:    Choice offered to:     DME Arranged:    DME Agency:     HH Arranged:    HH Agency:     Status of Service:  Completed, signed off  If discussed at [employer] Meetings, dates discussed:    Additional Comments:  04/22/17 J. Adonte Vanriper, RN, BSN Bed available today at [facility]; plan dc to residential Hospice facility, per CSW arrangements.    Quintella BatonJulie W. Miracle Mongillo, RN, BSN  Trauma/Neuro ICU Case Manager [phone]

## 2017-04-22 NOTE — Progress Notes (Signed)
Report called to [facility]. [facility] and [facility] by [facility].

## 2017-04-22 NOTE — Progress Notes (Signed)
[facility] Liaison:  RN visit  Received request from [facility] with PMT for family interest in [facility].  Chart reviewed.  Met with patient/family to confirm interest and explain services.  Family agreeable to transfer today.  Alena Bills, LCSW, aware.  Registration paperwork completed today at 1100am.  Dr. Orpah Melter to assume care per family request.  Please fax discharge summary to [phone].  RN, please call report to [phone].  Please arrange transport for patient to arrive as soon as possible.    Thank you for the referral,  Edyth Gunnels, RN, BSN [facility] Liaison [phone]  All hospital liaisons are now on [place].

## 2017-04-22 NOTE — Progress Notes (Signed)
Daily Progress Note   Patient Name: Theresa Cross       Date: 04/22/2017 DOB: 12/22/1924  Age: 83 y.o. MRN#: [id number] Attending Physician: Maeola HarmanStern, Joseph, MD Primary Care Physician: Chilton GreathouseAvva, Ravisankar, MD Admit Date: 04/19/2017  Reason for Consultation/Follow-up: Non pain symptom management, Psychosocial/spiritual support and Terminal Care  Subjective: Theresa Cross is no longer awake or responsive this morning. Her daughter at the bedside said she was awake and attempting to communicate last night, but has been asleep since that point. No signs of discomfort or distress. Daughter had multiple questions about disposition, which we discussed (see conversation below).  Length of Stay: 3  Current Medications: Scheduled Meds:  . mouth rinse  15 mL Mouth Rinse BID  . pantoprazole (PROTONIX) IV  40 mg Intravenous QHS    Continuous Infusions: . sodium chloride 10 mL/hr at 04/21/17 1114  . levETIRAcetam Stopped (04/22/17 0603)    PRN Meds: [DISCONTINUED] acetaminophen **OR** [DISCONTINUED] acetaminophen (TYLENOL) oral liquid 160 mg/5 mL **OR** acetaminophen, antiseptic oral rinse, bisacodyl, diphenhydrAMINE, glycopyrrolate, haloperidol lactate, labetalol, morphine injection, ondansetron (ZOFRAN) IV, polyvinyl alcohol  Physical Exam     Constitutional: She appears well-developed and well-nourished. No distress.  HENT:  Asleep, UTA mouth. Cardiovascular: Normal rate and regular rhythm.   Pulmonary/Chest: Effort normal. She has rhonchi (scattered).  Diminished breath sounds in bases.  Abdominal: Soft. Bowel sounds are normal.  Musculoskeletal:  UTA, pt does not wake to verbal or physical stimuli. No movement observed, nor could it be elicited.    Neurological:  Unresponsive.  Skin: Skin is warm and dry. Bruising noted. There is pallor. Hands and  feet cool, bruising noted but no mottling.   Psychiatric:  Appears calm and comfortable resting in bed       Vital Signs: BP (!) 158/60 (BP Location: Right Arm)   Pulse 67   Temp 99.3 F (37.4 C) (Axillary)   Resp 15   Ht 5\' 3"  (1.6 m)   Wt 61.9 kg (136 lb 7.4 oz)   SpO2 97%   BMI 24.17 kg/m  SpO2: SpO2: 97 % O2 Device: O2 Device: Nasal Cannula O2 Flow Rate: O2 Flow Rate (L/min): 1.5 L/min  Intake/output summary:  Intake/Output Summary (Last 24 hours) at 04/22/17 1007 Last data filed at 04/22/17 0500  Gross per 24 hour  Intake           377.33 ml  Output              850 ml  Net          -472.67 ml   LBM: Last BM Date: 04/21/17 Baseline Weight: Weight: 60.3 kg (132 lb 15 oz) Most recent weight: Weight: 61.9 kg (136 lb 7.4 oz)  Palliative Assessment/Data: PPS 10%   Flowsheet Rows     Most Recent Value  Intake Tab  Referral Department  Neurology  Unit at Time of Referral  ICU  Palliative Care Primary Diagnosis  Neurology  Date Notified  04/20/17  Palliative Care Type  New Palliative care  Reason for referral  Clarify Goals of Care  Date of Admission  04/19/17  # of days IP prior to Palliative referral  1  Clinical Assessment  Psychosocial &  Spiritual Assessment  Palliative Care Outcomes      Patient Active Problem List   Diagnosis Date Noted  . Palliative care by specialist   . Hypokalemia   . Malignant hypertension   . Acute encephalopathy   . Subarachnoid hemorrhage following injury (HCC) 04/19/2017  . Need for Tdap vaccination 01/26/2016  . CONSTIPATION 07/02/2009  . IRRITABLE BOWEL SYNDROME 07/02/2009    Palliative Care Assessment & Plan   HPI: 83 y.o. female  with past medical history of dementia, HOH (requires hearing aids), osteoporosis, and arthritis. She presented to the ED after being found confused and non-verbal in the late morning. Earlier in the morning she had fallen out of bed, for which EMS was called and felt she was fine. On arrival  to the ED she was alert but non-verbal and not following commands. She was admitted on 04/19/2017. Imaging revealed a non-aneurysmal SAH, ICH, and IVH. She was not a neurosurgical candidate. Prognosis poor per Neurology. Family clarified her DNR status. Plan for repeat family meeting on 7/26 with Neurology, at which point she was transitioned to full comfort measures. Palliative asked to follow-up to support pt and family.   Assessment: Theresa Cross is now unresponsive. She does appear comfortable with out any signs of discomfort or distress. Her daughter at the bedside had numerous concerns about transitioning her out of the hospital. [facility]. She and I talked through the benefits and burden of the hospital, as well as [facility]. I also emphasized my assessment that she is safe for transfer. After some discussion she did feel better with the plan.   Recommendations/Plan:  DNR, full comfort care, [facility]per SW, bed available)   Goals of Care and Additional Recommendations:  Limitations on Scope of Treatment: Full Comfort Care  Code Status:  DNR  Prognosis:   < 2 weeks  Discharge Planning:  Hospice facility  Care plan was discussed with family, SW, primary team paged with update on bed availability.   Thank you for allowing the Palliative Medicine Team to assist in the care of this patient.  Total time: 15 minutes    Greater than 50%  of this time was spent counseling and coordinating care related to the above assessment and plan.  Murrell ConverseSarah Taran Hable, NP Palliative Medicine Team [phone] pager (7a-5p) Team Phone # [phone]

## 2017-04-22 NOTE — Clinical Social Work Note (Signed)
Family has completed paperwork with Amy. [phone]. Signed DNR on chart. CSW arranged arranged transportation with [facility]. CSW communicated with family, Amy, and Charity fundraiserN. CSW signing off as no other SW need identified.  Corlis HoveJeneya Toluwani Yadav, LCSWA, LCASA Clinical Social Work [phone]

## 2017-04-22 NOTE — Progress Notes (Signed)
Subjective: Patient reports Opens eyes, shakes head "no" to "Are you hurting anywhere?"  Objective: Vital signs in last 24 hours:    Intake/Output from previous day: 07/26 0701 - 07/27 0700 In: 427.3 [I.V.:322.3; IV Piggyback:105] Out: 850 [Urine:850] Intake/Output this shift: No intake/output data recorded.  Awake now for the second time today per her son, she appears to be resting quietly.  She grasps my hand and shakes her head "no" when asked about pain.  Son and other family members have agreed to pursue comfort care. [facility]   Lab Results:  Recent Labs  04/20/17 0327 04/21/17 0209  WBC 14.1* 16.6*  HGB 13.0 11.9*  HCT 38.8 35.8*  PLT 296 271   BMET  Recent Labs  04/20/17 0327 04/21/17 0209  NA 133* 132*  K 3.5 3.4*  CL 97* 98*  CO2 26 25  GLUCOSE 146* 129*  BUN 12 17  CREATININE 0.65 0.52  CALCIUM 9.2 8.8*    Studies/Results: Dg Abd Portable 1v  Result Date: 04/20/2017 CLINICAL DATA:  Assess feeding tube placement. EXAM: PORTABLE ABDOMEN - 1 VIEW COMPARISON:  None in PACs FINDINGS: The the radiodense tip of the feeding tube projects in the region of the pylorus of the stomach. The bowel gas pattern is within the limits of normal. There is contrast within the urinary bladder. IMPRESSION: The feeding tube tip lies in the region of the pylorus. Electronically Signed   By: David  SwazilandJordan M.D.   On: 04/20/2017 14:38    Assessment/Plan:   LOS: 3 days  Per DrStern, discharge to SN[facility].    Georgiann Cockeroteat, Daniell Mancinas 04/22/2017, 2:14 PM

## 2017-04-22 NOTE — Clinical Social Work Note (Signed)
[facility] and [facility] pt today. [facility] 10[facility]30[facility] to sign paperwork. CSW paged MD to update. Will fax discharge summary when it's available.  [facility] bed available today. CSW continuing to follow for discharge needs.   Corlis HoveJeneya Jaspreet Bodner, LCSWA, LCASA Clinical Social Work  [phone]

## 2017-04-22 NOTE — Progress Notes (Signed)
Nutrition Brief Note  Chart reviewed. Pt now transitioning to comfort care.  No further nutrition interventions warranted at this time.  Please re-consult as needed.   Sumiya Mamaril A. Briton Sellman, RD, LDN, CDE Pager: [phone] After hours Pager: [phone]  

## 2017-04-25 NOTE — Care Management Important Message (Signed)
Important Message  Patient Details  Name: Theresa Cross MRN: [id number] Date of Birth: 02/27/1925  Patient signed on 04/22/2017  Medicare Important Message Given:  Yes    Irem Stoneham 04/25/2017, 9:10 AM

## 2017-04-27 DEATH — deceased

## 2017-06-07 ENCOUNTER — Ambulatory Visit: Payer: Medicare Other | Admitting: Neurology

## 2017-12-06 IMAGING — CT CT HEAD W/O CM
3 series · 14 of 47 positions shown, 16 images · non-contrast
Comparison: 04/19/2017 CT angiogram of head and neck and CT head.

CLINICAL DATA: [AGE]/o  F; fall with hemorrhage.

EXAM:
CT HEAD WITHOUT CONTRAST
TECHNIQUE: Contiguous axial images were obtained from the base of the skull
through the vertex without intravenous contrast.

[Series 3: head 5.0 h30s · axial · 0.42mm/px · z∈[-100,+35]mm · 8 of 33 slices shown, 10 images]
[im 3/33  brain]
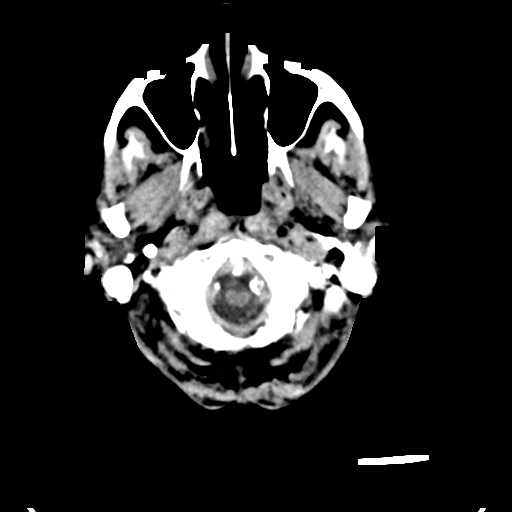
[im 3/33  bone]
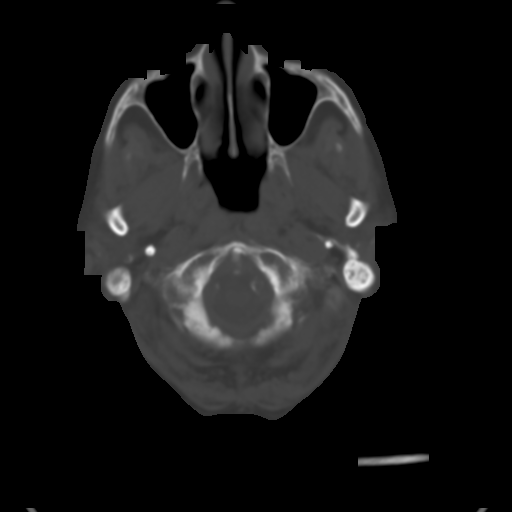
[im 7/33  brain]
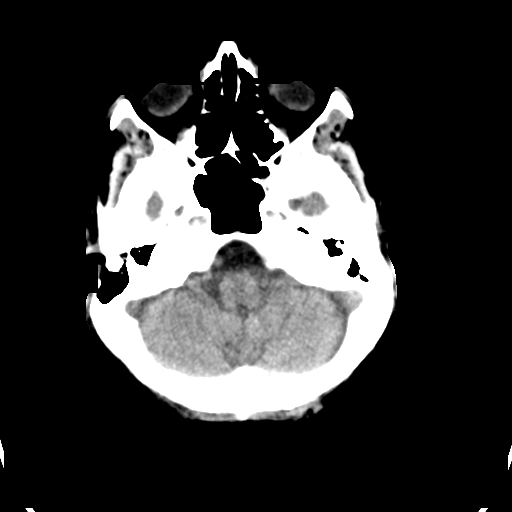
[im 10/33  brain]
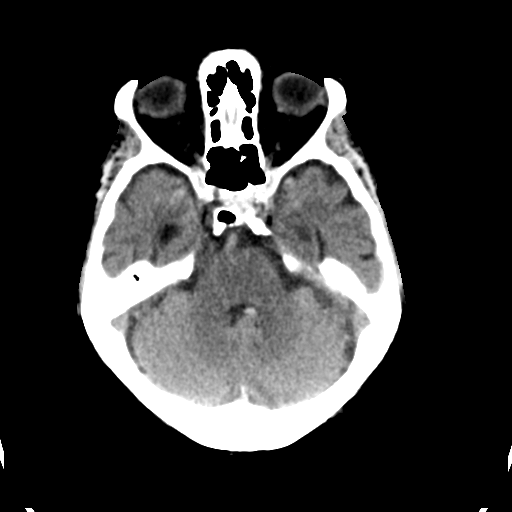
[im 15/33  brain]
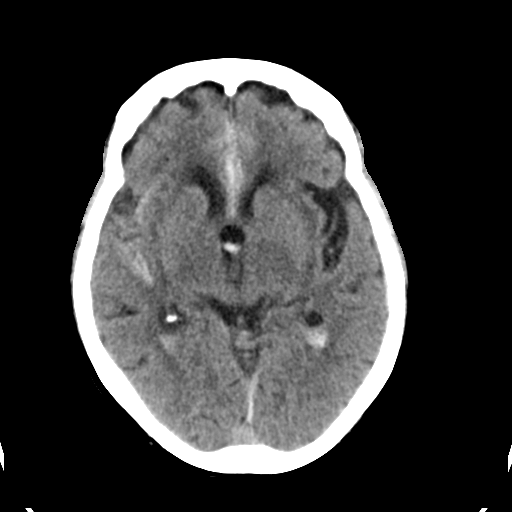
[im 18/33  brain]
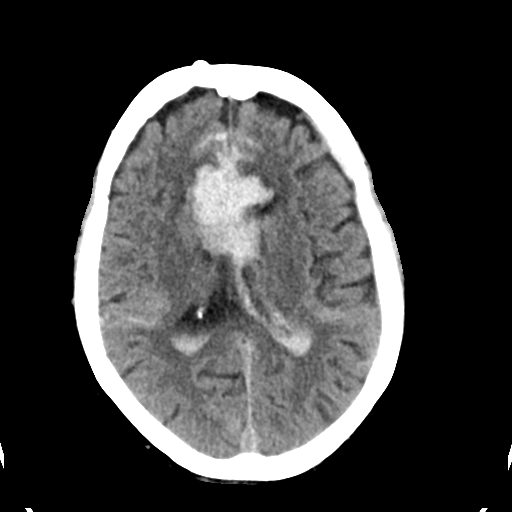
[im 18/33  bone]
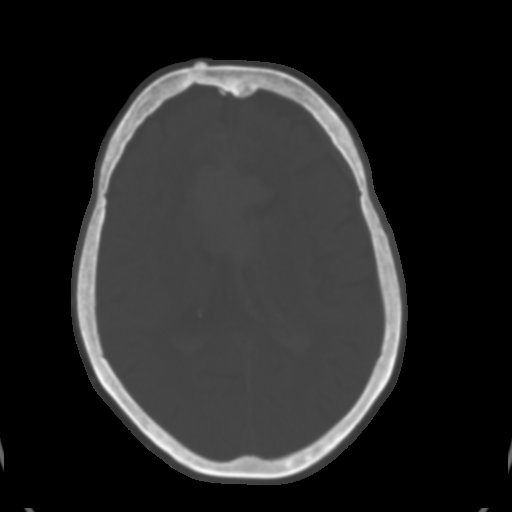
[im 23/33  brain]
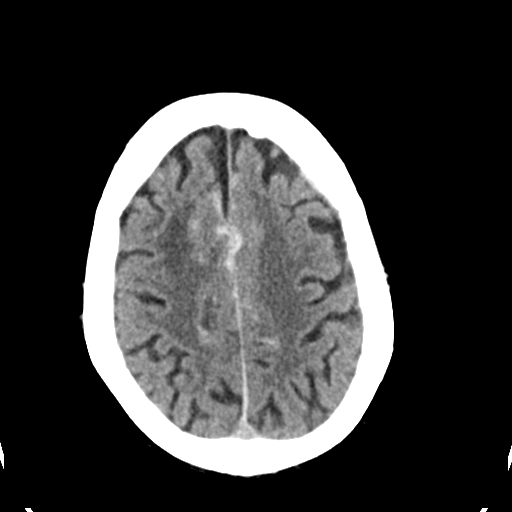
[im 26/33  brain]
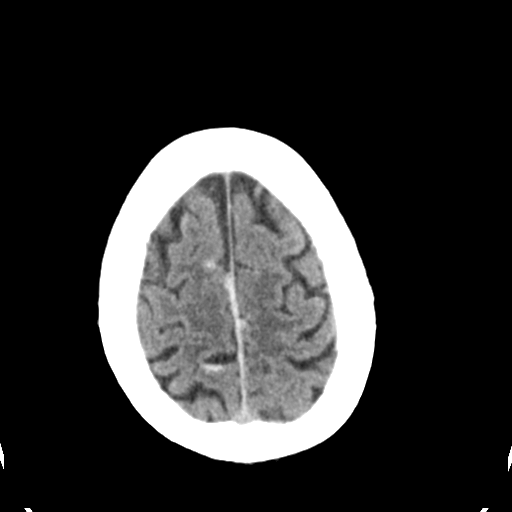
[im 30/33  brain]
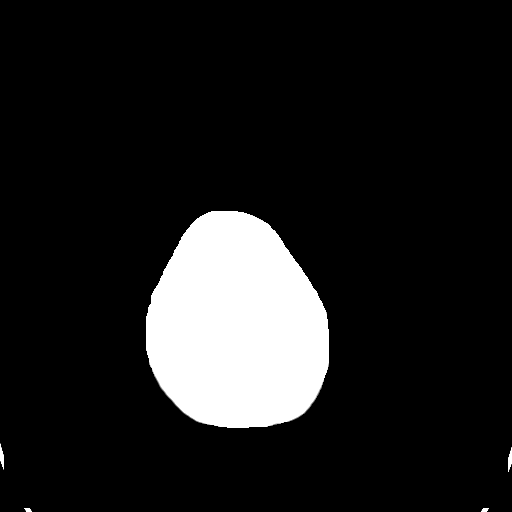

[Series 5: head 3.0 mpr cor · coronal · 0.32mm/px · 3 of 67 slices shown]
[im 23/67  brain]
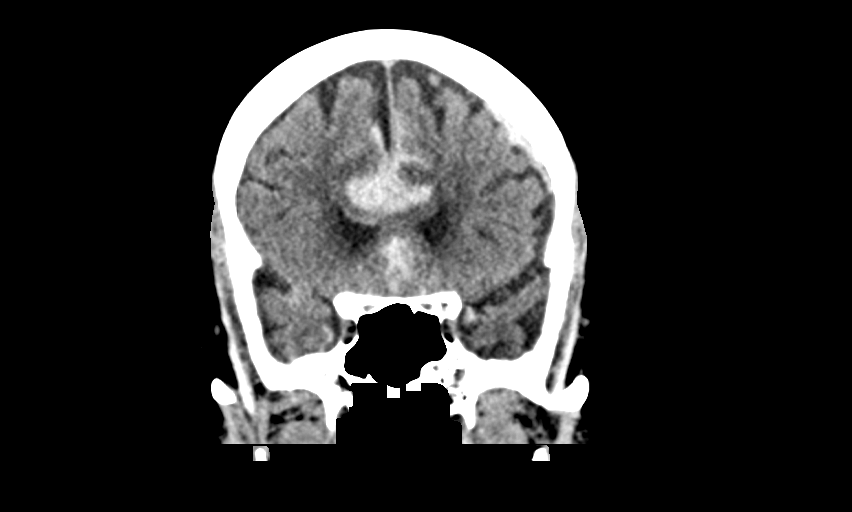
[im 30/67  brain]
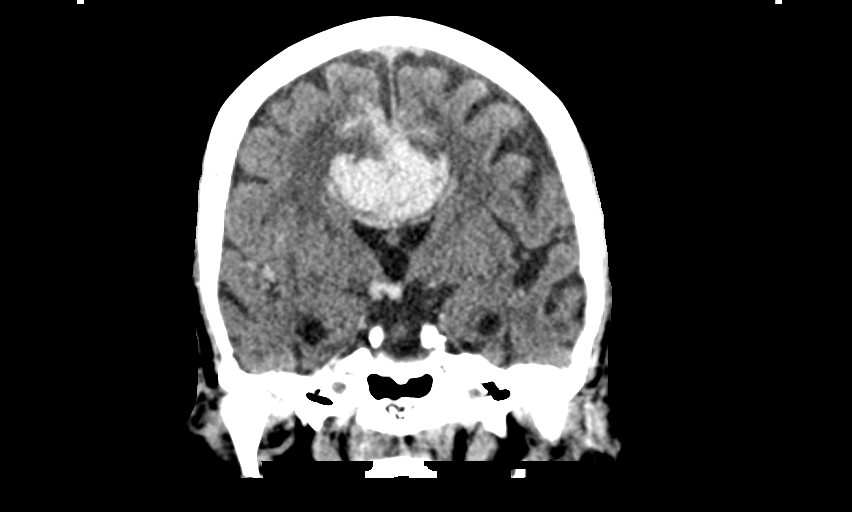
[im 37/67  brain]
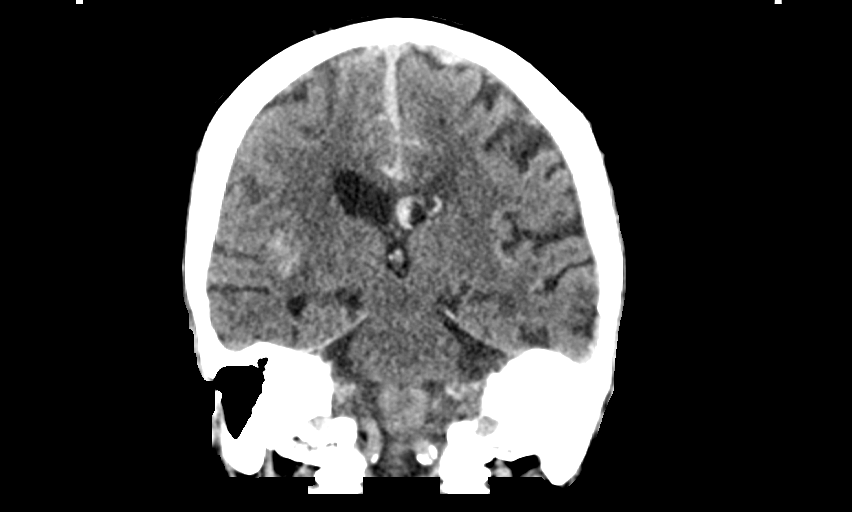

[Series 6: head 3.0 mpr sag · sagittal · 0.32mm/px · 3 of 55 slices shown]
[im 19/55  brain]
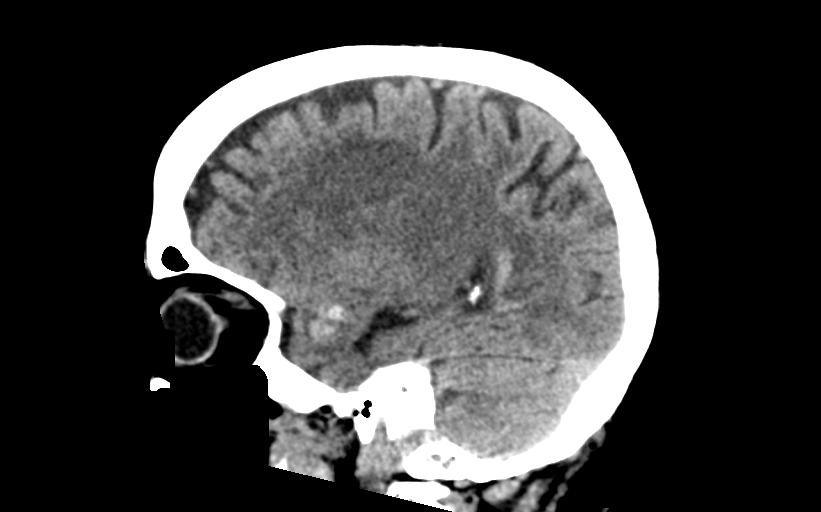
[im 28/55  brain]
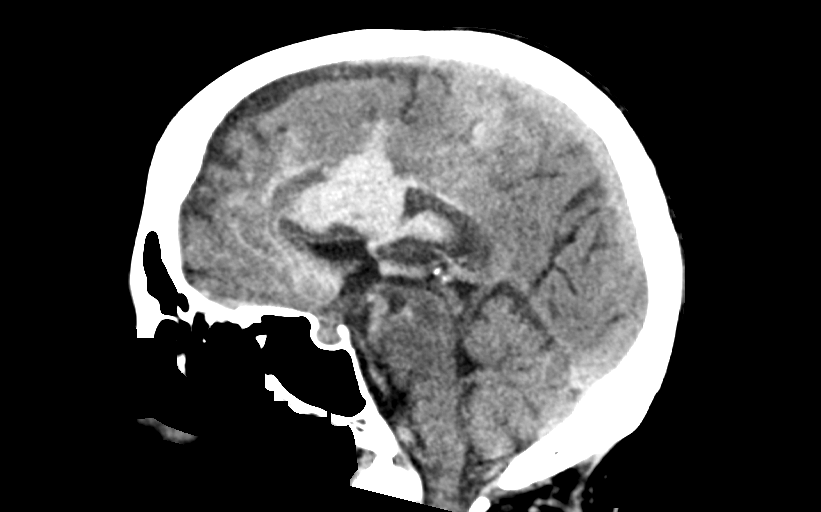
[im 37/55  brain]
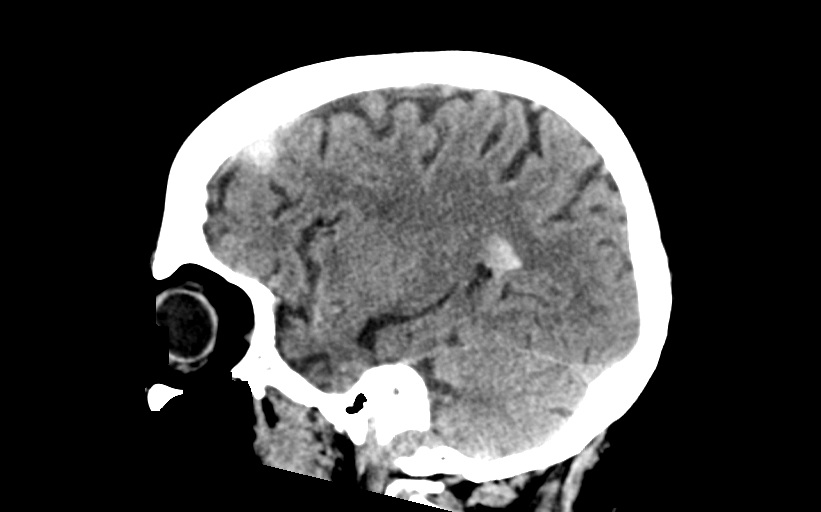

[14 of 47 positions shown; findings below may reference images not displayed]

FINDINGS: Brain: Stable large pericallosal hematoma, diffuse subarachnoid
hemorrhage, left frontal subdural hematoma, and intraventricular
hemorrhage. No evidence for new intracranial hemorrhage or large
territory stroke. Stable ventricle size. No herniation. Stable
background of chronic microvascular ischemic changes and brain
parenchymal volume loss.

Vascular: Mild calcific atherosclerosis of carotid siphons.

Skull: Normal. Negative for fracture or focal lesion.

Sinuses/Orbits: No acute finding.

Other: None.
IMPRESSION: 1. Stable large pericallosal hematoma, subarachnoid hemorrhage, left
frontal subdural hematoma, and intraventricular hemorrhage.
2. Stable ventricle size.  No herniation.
3. No new intracranial hemorrhage or brain parenchymal infarction
identified.

By: Blain Jumper M.D.
